# Patient Record
Sex: Male | Born: 1960 | Race: White | Hispanic: No | Marital: Married | Smoking: Never smoker
Health system: Southern US, Community
[De-identification: ages and names within clinical notes are randomized; demographics above are authoritative.]

## PROBLEM LIST (undated history)

## (undated) DIAGNOSIS — F419 Anxiety disorder, unspecified: Secondary | ICD-10-CM

## (undated) DIAGNOSIS — L989 Disorder of the skin and subcutaneous tissue, unspecified: Secondary | ICD-10-CM

## (undated) DIAGNOSIS — F329 Major depressive disorder, single episode, unspecified: Secondary | ICD-10-CM

## (undated) DIAGNOSIS — T7840XA Allergy, unspecified, initial encounter: Secondary | ICD-10-CM

## (undated) DIAGNOSIS — Z8619 Personal history of other infectious and parasitic diseases: Secondary | ICD-10-CM

## (undated) DIAGNOSIS — I10 Essential (primary) hypertension: Secondary | ICD-10-CM

## (undated) DIAGNOSIS — F32A Depression, unspecified: Secondary | ICD-10-CM

## (undated) HISTORY — DX: Anxiety disorder, unspecified: F41.9

## (undated) HISTORY — DX: Major depressive disorder, single episode, unspecified: F32.9

## (undated) HISTORY — PX: TONSILLECTOMY AND ADENOIDECTOMY: SUR1326

## (undated) HISTORY — DX: Personal history of other infectious and parasitic diseases: Z86.19

## (undated) HISTORY — DX: Disorder of the skin and subcutaneous tissue, unspecified: L98.9

## (undated) HISTORY — DX: Depression, unspecified: F32.A

## (undated) HISTORY — DX: Allergy, unspecified, initial encounter: T78.40XA

## (undated) HISTORY — DX: Essential (primary) hypertension: I10

---

## 2014-07-06 ENCOUNTER — Ambulatory Visit (INDEPENDENT_AMBULATORY_CARE_PROVIDER_SITE_OTHER): Payer: 59 | Admitting: Internal Medicine

## 2014-07-06 ENCOUNTER — Encounter: Payer: Self-pay | Admitting: Internal Medicine

## 2014-07-06 VITALS — BP 118/78 | HR 83 | Temp 98.0°F | Resp 14 | Ht 73.5 in | Wt 215.2 lb

## 2014-07-06 DIAGNOSIS — E559 Vitamin D deficiency, unspecified: Secondary | ICD-10-CM

## 2014-07-06 DIAGNOSIS — H18603 Keratoconus, unspecified, bilateral: Secondary | ICD-10-CM | POA: Diagnosis not present

## 2014-07-06 DIAGNOSIS — I10 Essential (primary) hypertension: Secondary | ICD-10-CM

## 2014-07-06 DIAGNOSIS — Z113 Encounter for screening for infections with a predominantly sexual mode of transmission: Secondary | ICD-10-CM

## 2014-07-06 DIAGNOSIS — Z13220 Encounter for screening for lipoid disorders: Secondary | ICD-10-CM | POA: Diagnosis not present

## 2014-07-06 DIAGNOSIS — Z1211 Encounter for screening for malignant neoplasm of colon: Secondary | ICD-10-CM

## 2014-07-06 DIAGNOSIS — Z125 Encounter for screening for malignant neoplasm of prostate: Secondary | ICD-10-CM

## 2014-07-06 DIAGNOSIS — F320 Major depressive disorder, single episode, mild: Secondary | ICD-10-CM

## 2014-07-06 DIAGNOSIS — R5383 Other fatigue: Secondary | ICD-10-CM

## 2014-07-06 LAB — CBC WITH DIFFERENTIAL/PLATELET
Basophils Absolute: 0 10*3/uL (ref 0.0–0.1)
Basophils Relative: 0 % (ref 0–1)
Eosinophils Absolute: 0.1 10*3/uL (ref 0.0–0.7)
Eosinophils Relative: 2 % (ref 0–5)
HCT: 42.4 % (ref 39.0–52.0)
Hemoglobin: 14.4 g/dL (ref 13.0–17.0)
Lymphocytes Relative: 42 % (ref 12–46)
Lymphs Abs: 2.2 10*3/uL (ref 0.7–4.0)
MCH: 29.4 pg (ref 26.0–34.0)
MCHC: 34 g/dL (ref 30.0–36.0)
MCV: 86.7 fL (ref 78.0–100.0)
MPV: 9.7 fL (ref 8.6–12.4)
Monocytes Absolute: 0.6 10*3/uL (ref 0.1–1.0)
Monocytes Relative: 11 % (ref 3–12)
Neutro Abs: 2.3 10*3/uL (ref 1.7–7.7)
Neutrophils Relative %: 45 % (ref 43–77)
Platelets: 296 10*3/uL (ref 150–400)
RBC: 4.89 MIL/uL (ref 4.22–5.81)
RDW: 13 % (ref 11.5–15.5)
WBC: 5.2 10*3/uL (ref 4.0–10.5)

## 2014-07-06 NOTE — Progress Notes (Signed)
Patient ID: Kevin BickerDavid Barry, male   DOB: 06/06/1960, 54 y.o.   MRN: [id number]  Patient Active Problem List   Diagnosis Date Noted  . Essential hypertension 07/08/2014  . Screening for hyperlipidemia 07/08/2014  . Major depressive disorder, single episode 07/08/2014  . Keratoconus of both eyes 07/08/2014    Subjective:  CC:   Chief Complaint  Patient presents with  . Establish Care    HPI:   Kevin Barry a 54 y.o. male who presents for establishment of primary care with several issues;  1) vision changes.  He has a history of keratoconus (corneal curvature) of both eyes that [place] .  Did not tolerate bifocals .  The condition ins bilateral, but R > L .  He has had the condition for years .   2) weight gain of 10 lbs.  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  no history  Of syncope,   GPs died in 3270's of CAD, stroke  .  3) Health screening : colonoscopy needed  PSA needed  4) Hypertension:  He has been taking lisinopril daily x 10 years for BP   5) Depression:  He has been taking wellbutrin daily for 10 years   SR 150 no dose change  Did not tolerate the once daily XL .  Has occasional  trouble sleeping. dtr has Down's 54 yrs old,  [facility] . uses benadryl 25 mg prn   Has 4 kids two from each marriage.    Past Medical History  Diagnosis Date  . Depression   . Allergy   . Hypertension   . History of chicken pox   . Disorder of keratinization     right eye.        @ALL @  Past Surgical History  Procedure Laterality Date  . Tonsillectomy and adenoidectomy      History   Social History  . Marital Status: Married    Spouse Name: N/A  . Number of Children: N/A  . Years of Education: N/A   Occupational History  . Not on file.   Social History Main Topics  . Smoking status: Never Smoker   . Smokeless tobacco: Never Used  .  Alcohol Use: Yes     Comment: maybe once a week  . Drug Use: No  . Sexual Activity: Not on file   Other Topics Concern  . Not on file   Social History Narrative   Family History  Problem Relation Age of Onset  . Cancer Mother     Hodgkin disease  . Heart disease Mother 4972    aortic valve replacement  . Heart disease Father 4556    MI  . Early death Father 7156    acute MI  . Crohn's disease Brother   . Heart disease Maternal Grandmother   . Stroke Maternal Grandmother   . Cancer Maternal Grandmother     unsure which cancer  . Cancer Maternal Grandfather     pancreatic cancer?  . Heart disease Paternal Grandfather   . Stroke Paternal Grandfather       Review of Systems: Patient denies headache, fevers, malaise, unintentional weight loss, skin rash, eye pain, sinus congestion and sinus pain, sore throat, dysphagia,  hemoptysis , cough, dyspnea, wheezing, chest pain, palpitations, orthopnea, edema, abdominal pain, nausea, melena, diarrhea, constipation, flank pain, dysuria, hematuria, urinary  Frequency,  nocturia, numbness, tingling, seizures,  Focal weakness, Loss of consciousness,  Tremor, insomnia, depression, anxiety, and suicidal ideation.     Objective:  BP 118/78 mmHg  Pulse 83  Temp(Src) 98 F (36.7 C) (Oral)  Resp 14  Ht 6' 1.5" (1.867 m)  Wt 215 lb 4 oz (97.637 kg)  BMI 28.01 kg/m2  SpO2 97%  General appearance: alert, cooperative and appears stated age Ears: normal TM's and external ear canals both ears Throat: lips, mucosa, and tongue normal; teeth and gums normal Neck: no adenopathy, no carotid bruit, supple, symmetrical, trachea midline and thyroid not enlarged, symmetric, no tenderness/mass/nodules Back: symmetric, no curvature. ROM normal. No CVA tenderness. Lungs: clear to auscultation bilaterally Heart: regular rate and rhythm, S1, S2 normal, no murmur, click, rub or gallop Abdomen: soft, non-tender; bowel sounds normal; no masses,  no  organomegaly Pulses: 2+ and symmetric Skin: Skin color, texture, turgor normal. No rashes or lesions Lymph nodes: Cervical, supraclavicular, and axillary nodes normal.  Assessment and Plan:  Essential hypertension Managed for ten years with lisinopril. Cr and lytes are normal today   Lab Results  Component Value Date   CREATININE 1.00 07/06/2014   Lab Results  Component Value Date   NA 141 07/06/2014   K 4.5 07/06/2014   CL 101 07/06/2014   CO2 30 07/06/2014      Screening for hyperlipidemia LDL and triglycerides are normal on diet alone .   Lab Results  Component Value Date   CHOL 172 07/06/2014   HDL 37* 07/06/2014   LDLCALC 112* 07/06/2014   TRIG 117 07/06/2014   CHOLHDL 4.6 07/06/2014      Major depressive disorder, single episode Managed with wellbutrin XL. No changes today    Keratoconus of both eyes Referral to [facility] in process.    Updated Medication List Outpatient Encounter Prescriptions as of 07/06/2014  Medication Sig  . buPROPion (WELLBUTRIN SR) 150 MG 12 hr tablet Take 150 mg by mouth 2 (two) times daily.  Marland Kitchen lisinopril (PRINIVIL,ZESTRIL) 5 MG tablet Take 5 mg by mouth daily.     Orders Placed This Encounter  Procedures  . HIV antibody  . Hepatitis C antibody  . CBC with Differential/Platelet  . Comprehensive metabolic panel  . Lipid panel  . TSH  . PSA  . Vitamin D (25 hydroxy)  . Ambulatory referral to Ophthalmology  . Ambulatory referral to General Surgery    Return in about 6 months (around 01/05/2015).

## 2014-07-06 NOTE — Patient Instructions (Signed)
It was very nice to meet you!  We will call you with the results of your labs today  Referral to  eye and  Surgical are in process

## 2014-07-06 NOTE — Progress Notes (Signed)
Pre visit review using our clinic review tool, if applicable. No additional management support is needed unless otherwise documented below in the visit note. 

## 2014-07-07 LAB — VITAMIN D 25 HYDROXY (VIT D DEFICIENCY, FRACTURES): Vit D, 25-Hydroxy: 27 ng/mL — ABNORMAL LOW (ref 30–100)

## 2014-07-07 LAB — COMPREHENSIVE METABOLIC PANEL
ALT: 31 U/L (ref 0–53)
AST: 22 U/L (ref 0–37)
Albumin: 4.6 g/dL (ref 3.5–5.2)
Alkaline Phosphatase: 45 U/L (ref 39–117)
BUN: 13 mg/dL (ref 6–23)
CO2: 30 mEq/L (ref 19–32)
Calcium: 9.6 mg/dL (ref 8.4–10.5)
Chloride: 101 mEq/L (ref 96–112)
Creat: 1 mg/dL (ref 0.50–1.35)
Glucose, Bld: 71 mg/dL (ref 70–99)
Potassium: 4.5 mEq/L (ref 3.5–5.3)
Sodium: 141 mEq/L (ref 135–145)
Total Bilirubin: 0.4 mg/dL (ref 0.2–1.2)
Total Protein: 7.9 g/dL (ref 6.0–8.3)

## 2014-07-07 LAB — LIPID PANEL
Cholesterol: 172 mg/dL (ref 0–200)
HDL: 37 mg/dL — ABNORMAL LOW (ref >=40)
LDL Cholesterol: 112 mg/dL — ABNORMAL HIGH (ref 0–99)
Total CHOL/HDL Ratio: 4.6 Ratio
Triglycerides: 117 mg/dL (ref <150)
VLDL: 23 mg/dL (ref 0–40)

## 2014-07-07 LAB — PSA: PSA: 1.22 ng/mL (ref <=4.00)

## 2014-07-07 LAB — HEPATITIS C ANTIBODY: HCV Ab: NEGATIVE

## 2014-07-07 LAB — HIV ANTIBODY (ROUTINE TESTING W REFLEX): HIV 1&2 Ab, 4th Generation: NONREACTIVE

## 2014-07-07 LAB — TSH: TSH: 1.42 u[IU]/mL (ref 0.350–4.500)

## 2014-07-08 ENCOUNTER — Encounter: Payer: Self-pay | Admitting: Internal Medicine

## 2014-07-08 DIAGNOSIS — I10 Essential (primary) hypertension: Secondary | ICD-10-CM | POA: Insufficient documentation

## 2014-07-08 DIAGNOSIS — H18603 Keratoconus, unspecified, bilateral: Secondary | ICD-10-CM | POA: Insufficient documentation

## 2014-07-08 DIAGNOSIS — Z0000 Encounter for general adult medical examination without abnormal findings: Secondary | ICD-10-CM | POA: Insufficient documentation

## 2014-07-08 DIAGNOSIS — F329 Major depressive disorder, single episode, unspecified: Secondary | ICD-10-CM | POA: Insufficient documentation

## 2014-07-08 NOTE — Assessment & Plan Note (Signed)
LDL and triglycerides are normal on diet alone .   Lab Results  Component Value Date   CHOL 172 07/06/2014   HDL 37* 07/06/2014   LDLCALC 112* 07/06/2014   TRIG 117 07/06/2014   CHOLHDL 4.6 07/06/2014

## 2014-07-08 NOTE — Assessment & Plan Note (Signed)
Managed for ten years with lisinopril. Cr and lytes are normal today   Lab Results  Component Value Date   CREATININE 1.00 07/06/2014   Lab Results  Component Value Date   NA 141 07/06/2014   K 4.5 07/06/2014   CL 101 07/06/2014   CO2 30 07/06/2014

## 2014-07-08 NOTE — Assessment & Plan Note (Signed)
Managed with wellbutrin XL. No changes today

## 2014-07-08 NOTE — Assessment & Plan Note (Signed)
Referral to [facility] in process.

## 2014-07-09 ENCOUNTER — Encounter: Payer: Self-pay | Admitting: *Deleted

## 2014-07-12 ENCOUNTER — Encounter: Payer: Self-pay | Admitting: *Deleted

## 2014-07-25 ENCOUNTER — Encounter: Payer: Self-pay | Admitting: General Surgery

## 2014-07-25 ENCOUNTER — Telehealth: Payer: Self-pay

## 2014-07-25 ENCOUNTER — Ambulatory Visit (INDEPENDENT_AMBULATORY_CARE_PROVIDER_SITE_OTHER): Payer: 59 | Admitting: General Surgery

## 2014-07-25 VITALS — BP 148/86 | HR 82 | Resp 14 | Ht 74.0 in | Wt 214.0 lb

## 2014-07-25 DIAGNOSIS — Z1211 Encounter for screening for malignant neoplasm of colon: Secondary | ICD-10-CM

## 2014-07-25 MED ORDER — POLYETHYLENE GLYCOL 3350 17 GM/SCOOP PO POWD: ORAL | Status: DC

## 2014-07-25 NOTE — Telephone Encounter (Signed)
Kevin Barry from [facility] called and is hoping to verify that a procedure this pt is having is authorized.  Callback - [phone]

## 2014-07-25 NOTE — Patient Instructions (Signed)
Patient has been scheduled for a colonoscopy on 10-03-14 at [facility].

## 2014-07-25 NOTE — Progress Notes (Signed)
Patient ID: Kevin Barry Hefley, male   DOB: 07/09/1960, 54 y.o.   MRN: [id number]  Chief Complaint  Patient presents with  . Other    colonoscopy sreening    HPI Kevin Barry Retz is a 54 y.o. male here today for a evaluation of a screening colonoscopy. Patient states no GI problems at this time.   The patient is a native of [place]. [place]'s [place] the states [place] about [place] years. HPI  Past Medical History  Diagnosis Date  . Depression   . Allergy   . Hypertension   . History of chicken pox   . Disorder of keratinization     right eye.     Past Surgical History  Procedure Laterality Date  . Tonsillectomy and adenoidectomy      Family History  Problem Relation Age of Onset  . Cancer Mother     Hodgkin disease  . Heart disease Mother 3772    aortic valve replacement  . Heart disease Father 5656    MI  . Early death Father 2756    acute MI  . Crohn's disease Brother   . Heart disease Maternal Grandmother   . Stroke Maternal Grandmother   . Cancer Maternal Grandmother     unsure which cancer  . Cancer Maternal Grandfather     pancreatic cancer?  . Heart disease Paternal Grandfather   . Stroke Paternal Grandfather     Social History History  Substance Use Topics  . Smoking status: Never Smoker   . Smokeless tobacco: Never Used  . Alcohol Use: Yes     Comment: maybe once a week    Allergies  Allergen Reactions  . Penicillins Rash    Current Outpatient Prescriptions  Medication Sig Dispense Refill  . buPROPion (WELLBUTRIN SR) 150 MG 12 hr tablet Take 150 mg by mouth 2 (two) times daily.    Marland Kitchen. lisinopril (PRINIVIL,ZESTRIL) 5 MG tablet Take 5 mg by mouth daily.    . polyethylene glycol powder (GLYCOLAX/MIRALAX) powder 255 grams one bottle for colonoscopy prep 255 g 0   No current facility-administered medications for this visit.    Review of Systems Review of Systems  Constitutional: Negative.   Respiratory: Negative.   Cardiovascular: Negative.    Gastrointestinal: Negative.     Blood pressure 148/86, pulse 82, resp. rate 14, height 6\' 2"  (1.88 m), weight 214 lb (97.07 kg).  Physical Exam Physical Exam  Constitutional: He is oriented to person, place, and time. He appears well-developed and well-nourished.  Cardiovascular: Normal rate, regular rhythm and normal heart sounds.   Pulmonary/Chest: Effort normal and breath sounds normal.  Neurological: He is alert and oriented to person, place, and time.  Skin: Skin is warm.    Data Reviewed Laboratory studies of 07/06/2014 reviewed. Normal renal function and hepatic function. Normal CBC and differential.  Assessment    Candidate for screening colonoscopy.    Plan    Colonoscopy with possible biopsy/polypectomy prn: Information regarding the procedure, including its potential risks and complications (including but not limited to perforation of the bowel, which may require emergency surgery to repair, and bleeding) was verbally given to the patient. Educational information regarding lower instestinal endoscopy was given to the patient. Written instructions for how to complete the bowel prep using Miralax were provided. The importance of drinking ample fluids to avoid dehydration as a result of the prep emphasized.    Patient has been scheduled for a colonoscopy on 10-03-14 at [facility].  PCP:  Darrick Huntsmanullo,  [place] 07/25/2014, 8:00 PM

## 2014-07-26 ENCOUNTER — Other Ambulatory Visit: Payer: Self-pay | Admitting: General Surgery

## 2014-07-26 DIAGNOSIS — Z1211 Encounter for screening for malignant neoplasm of colon: Secondary | ICD-10-CM

## 2014-09-14 ENCOUNTER — Other Ambulatory Visit: Payer: Self-pay | Admitting: General Surgery

## 2014-09-14 DIAGNOSIS — Z1211 Encounter for screening for malignant neoplasm of colon: Secondary | ICD-10-CM

## 2014-09-14 NOTE — H&P (Signed)
Patient ID: Kevin Barry, male DOB: 1961-03-25, 54 y.o. MRN: [id number]  Chief Complaint  Patient presents with  . Other    colonoscopy sreening    HPI Kevin Barry is a 54 y.o. male here today for a evaluation of a screening colonoscopy. Patient states no GI problems at this time.   The patient is a native of [place], [place]. He's been [place] the states for about [place] years. HPI  Past Medical History  Diagnosis Date  . Depression   . Allergy   . Hypertension   . History of chicken pox   . Disorder of keratinization     right eye.     Past Surgical History  Procedure Laterality Date  . Tonsillectomy and adenoidectomy      Family History  Problem Relation Age of Onset  . Cancer Mother     Hodgkin disease  . Heart disease Mother 68    aortic valve replacement  . Heart disease Father 53    MI  . Early death Father 36    acute MI  . Crohn's disease Brother   . Heart disease Maternal Grandmother   . Stroke Maternal Grandmother   . Cancer Maternal Grandmother     unsure which cancer  . Cancer Maternal Grandfather     pancreatic cancer?  . Heart disease Paternal Grandfather   . Stroke Paternal Grandfather     Social History History  Substance Use Topics  . Smoking status: Never Smoker   . Smokeless tobacco: Never Used  . Alcohol Use: Yes     Comment: maybe once a week    Allergies  Allergen Reactions  . Penicillins Rash    Current Outpatient Prescriptions  Medication Sig Dispense Refill  . buPROPion (WELLBUTRIN SR) 150 MG 12 hr tablet Take 150 mg by mouth 2 (two) times daily.    Marland Kitchen lisinopril (PRINIVIL,ZESTRIL) 5 MG tablet Take 5 mg by mouth daily.    . polyethylene glycol powder (GLYCOLAX/MIRALAX) powder 255 grams one bottle for colonoscopy prep 255 g 0   No current facility-administered medications  for this visit.    Review of Systems Review of Systems  Constitutional: Negative.  Respiratory: Negative.  Cardiovascular: Negative.  Gastrointestinal: Negative.    Blood pressure 148/86, pulse 82, resp. rate 14, height 6\' 2"  (1.88 m), weight 214 lb (97.07 kg).  Physical Exam Physical Exam  Constitutional: He is oriented to person, place, and time. He appears well-developed and well-nourished.  Cardiovascular: Normal rate, regular rhythm and normal heart sounds.  Pulmonary/Chest: Effort normal and breath sounds normal.  Neurological: He is alert and oriented to person, place, and time.  Skin: Skin is warm.    Data Reviewed Laboratory studies of 07/06/2014 reviewed. Normal renal function and hepatic function. Normal CBC and differential.  Assessment    Candidate for screening colonoscopy.    Plan    Colonoscopy with possible biopsy/polypectomy prn: Information regarding the procedure, including its potential risks and complications (including but not limited to perforation of the bowel, which may require emergency surgery to repair, and bleeding) was verbally given to the patient. Educational information regarding lower instestinal endoscopy was given to the patient. Written instructions for how to complete the bowel prep using Miralax were provided. The importance of drinking ample fluids to avoid dehydration as a result of the prep emphasized.    Patient has been scheduled for a colonoscopy on 10-03-14 at [facility].  PCP: Macky Lower

## 2014-09-26 ENCOUNTER — Telehealth: Payer: Self-pay | Admitting: *Deleted

## 2014-09-26 NOTE — Telephone Encounter (Signed)
Message left on home and cell numbers for patient to call the office.   We need to confirm no medication changes since last office visit. Also, need to confirm patient has Miralax prescription.   Patient is currently scheduled for a colonoscopy on Wednesday, 10-03-14.

## 2014-09-27 NOTE — Telephone Encounter (Signed)
Patient called the office back to report he has an unexpected trip to [place] and will need to reschedule colonoscopy.   Colonoscopy has been moved from 10-03-14 to 12-05-14 at [facility]. Trish aware of date change.

## 2014-11-28 ENCOUNTER — Telehealth: Payer: Self-pay | Admitting: *Deleted

## 2014-11-28 NOTE — Telephone Encounter (Signed)
Patient called back to report no medication changes since last office visit. Also, states he has already picked up Miralax prescription.  This patient was instructed to call the office should he have further questions.   We will proceed with colonoscopy as scheduled.

## 2014-11-28 NOTE — Telephone Encounter (Signed)
Message left on home and cell numbers for patient to call the office.   Patient is scheduled for a colonoscopy on 12-05-14 at [facility]. We need to confirm no medication changes since last office visit. Also, need to make sure he has Miralax prescription.

## 2014-12-05 ENCOUNTER — Ambulatory Visit
Admission: RE | Admit: 2014-12-05 | Discharge: 2014-12-05 | Disposition: A | Discharge status: Home / Self Care | Payer: 59 | Source: Ambulatory Visit | Attending: General Surgery | Admitting: General Surgery

## 2014-12-05 ENCOUNTER — Encounter
Admission: RE | Disposition: A | Discharge status: Home / Self Care | Payer: Self-pay | Source: Ambulatory Visit | Attending: General Surgery

## 2014-12-05 ENCOUNTER — Ambulatory Visit: Payer: 59 | Admitting: Anesthesiology

## 2014-12-05 ENCOUNTER — Encounter: Payer: Self-pay | Admitting: *Deleted

## 2014-12-05 DIAGNOSIS — F329 Major depressive disorder, single episode, unspecified: Secondary | ICD-10-CM | POA: Diagnosis not present

## 2014-12-05 DIAGNOSIS — K5730 Diverticulosis of large intestine without perforation or abscess without bleeding: Secondary | ICD-10-CM | POA: Diagnosis not present

## 2014-12-05 DIAGNOSIS — I10 Essential (primary) hypertension: Secondary | ICD-10-CM | POA: Insufficient documentation

## 2014-12-05 DIAGNOSIS — Z1211 Encounter for screening for malignant neoplasm of colon: Secondary | ICD-10-CM | POA: Insufficient documentation

## 2014-12-05 DIAGNOSIS — Z880 Allergy status to penicillin: Secondary | ICD-10-CM | POA: Insufficient documentation

## 2014-12-05 DIAGNOSIS — K621 Rectal polyp: Secondary | ICD-10-CM | POA: Diagnosis not present

## 2014-12-05 HISTORY — PX: COLONOSCOPY WITH PROPOFOL: SHX5780

## 2014-12-05 SURGERY — COLONOSCOPY WITH PROPOFOL
Anesthesia: General

## 2014-12-05 MED ORDER — PROPOFOL INFUSION 10 MG/ML OPTIME
INTRAVENOUS | Status: DC | PRN
  Administered 2014-12-05: 180 ug/kg/min via INTRAVENOUS

## 2014-12-05 MED ORDER — LIDOCAINE HCL (CARDIAC) 20 MG/ML IV SOLN
INTRAVENOUS | Status: DC | PRN
  Administered 2014-12-05: 60 mg via INTRAVENOUS

## 2014-12-05 MED ORDER — SODIUM CHLORIDE 0.9 % IV SOLN
INTRAVENOUS | Status: DC
  Administered 2014-12-05: 1000 mL via INTRAVENOUS

## 2014-12-05 MED ORDER — PROPOFOL 10 MG/ML IV BOLUS
INTRAVENOUS | Status: DC | PRN
  Administered 2014-12-05: 20 mg via INTRAVENOUS
  Administered 2014-12-05: 50 mg via INTRAVENOUS

## 2014-12-05 NOTE — Op Note (Signed)
[facility] Patient Name: Kevin Barry Procedure Date: 12/05/2014 9:04 AM MRN: [id number] Account #: [id number] Date of Birth: 09/01/1960 Admit Type: Outpatient Age: 54 Room: [facility] ENDO ROOM 4 Gender: Male Note Status: Finalized Procedure:         Colonoscopy Indications:       Screening for colorectal malignant neoplasm Providers:         Earline Mayotte, MD Referring MD:      Duncan Dull, MD (Referring MD) Medicines:         Sedation Required Anesthesia Staff Assistance, Monitored                     Anesthesia Care Complications:     No immediate complications. Procedure:         Pre-Anesthesia Assessment:                    - Prior to the procedure, a History and Physical was                     performed, and patient medications, allergies and                     sensitivities were reviewed. The patient's tolerance of                     previous anesthesia was reviewed.                    - The risks and benefits of the procedure and the sedation                     options and risks were discussed with the patient. All                     questions were answered and informed consent was obtained.                    After obtaining informed consent, the colonoscope was                     passed under direct vision. Throughout the procedure, the                     patient's blood pressure, pulse, and oxygen saturations                     were monitored continuously. The Olympus CF-Q160AL                     colonoscope (S#. [id number]) was introduced through the anus                     and advanced to the the cecum, identified by appendiceal                     orifice and ileocecal valve. The colonoscopy was performed                     without difficulty. The patient tolerated the procedure                     well. The quality of the bowel preparation was adequate to  identify polyps. Findings:      A few  large-mouthed diverticula were found in the sigmoid colon.      A 5 mm polyp was found in the rectum. The polyp was sessile. This was       biopsied with a cold forceps for histology.      The retroflexed view of the distal rectum and anal verge was normal and       showed no anal or rectal abnormalities. Impression:        - Diverticulosis in the sigmoid colon.                    - One 5 mm polyp in the rectum. Biopsied.                    - The distal rectum and anal verge are normal on                     retroflexion view. Recommendation:    - Telephone endoscopist for pathology results in 1 week.                    - High fiber diet indefinitely. Procedure Code(s): --- Professional ---                    [phone], Colonoscopy, flexible; with biopsy, single or                     multiple Diagnosis Code(s): --- Professional ---                    Z12.11, Encounter for screening for malignant neoplasm of                     colon                    K62.1, Rectal polyp                    K57.30, Diverticulosis of large intestine without                     perforation or abscess without bleeding CPT copyright 2014 American Medical Association. All rights reserved. The codes documented in this report are preliminary and upon coder review may  be revised to meet current compliance requirements. Earline Mayotte, MD 12/05/2014 9:33:13 AM This report has been signed electronically. Number of Addenda: 0 Note Initiated On: 12/05/2014 9:04 AM Scope Withdrawal Time: 0 hours 11 minutes 19 seconds  Total Procedure Duration: 0 hours 19 minutes 53 seconds       [facility]

## 2014-12-05 NOTE — H&P (Signed)
Kevin Barry [id number] [birth year]     HPI: Candidate for screening colonoscopy. No health changes since initial evaluation in April 2016.      Allergies  Allergen Reactions  . Penicillins Rash   Past Medical History  Diagnosis Date  . Depression   . Allergy   . Hypertension   . History of chicken pox   . Disorder of keratinization     right eye.    Past Surgical History  Procedure Laterality Date  . Tonsillectomy and adenoidectomy     Social History   Social History  . Marital Status: Married    Spouse Name: N/A  . Number of Children: N/A  . Years of Education: N/A   Occupational History  . Not on file.   Social History Main Topics  . Smoking status: Never Smoker   . Smokeless tobacco: Never Used  . Alcohol Use: Yes     Comment: maybe once a week  . Drug Use: No  . Sexual Activity: Not on file   Other Topics Concern  . Not on file   Social History Narrative   Social History   Social History Narrative     ROS: Negative.     PE: HEENT: Negative. Lungs: Clear. Cardio: RR. Earline Mayotte 12/05/2014   Assessment/Plan:  Proceed with planned endoscopy.

## 2014-12-05 NOTE — Anesthesia Procedure Notes (Signed)
Performed by: Maveryk Renstrom Pre-anesthesia Checklist: Patient identified, Emergency Drugs available, Suction available, Patient being monitored and Timeout performed Patient Re-evaluated:Patient Re-evaluated prior to inductionOxygen Delivery Method: Nasal cannula Intubation Type: IV induction       

## 2014-12-05 NOTE — Transfer of Care (Signed)
Immediate Anesthesia Transfer of Care Note  Patient: Kevin Barry  Procedure(s) Performed: Procedure(s): COLONOSCOPY WITH PROPOFOL (N/A)  Patient Location: PACU  Anesthesia Type:General  Level of Consciousness: sedated  Airway & Oxygen Therapy: Patient Spontanous Breathing and Patient connected to nasal cannula oxygen  Post-op Assessment: Report given to RN and Post -op Vital signs reviewed and stable  Post vital signs: Reviewed and stable  Last Vitals:  Filed Vitals:   12/05/14 0937  BP: 101/72  Pulse: 74  Temp: 35.6 C  Resp: 14    Complications: No apparent anesthesia complications

## 2014-12-05 NOTE — Anesthesia Preprocedure Evaluation (Signed)
Anesthesia Evaluation  Patient identified by MRN, date of birth, ID band Patient awake    Reviewed: Allergy & Precautions, H&P , NPO status , Patient's Chart, lab work & pertinent test results, reviewed documented beta blocker date and time   History of Anesthesia Complications Negative for: history of anesthetic complications  Airway Mallampati: I  TM Distance: >3 FB Neck ROM: full    Dental no notable dental hx. (+) Teeth Intact   Pulmonary neg pulmonary ROS,    Pulmonary exam normal breath sounds clear to auscultation       Cardiovascular Exercise Tolerance: Good hypertension, (-) angina(-) CAD, (-) Past MI, (-) Cardiac Stents and (-) CABG Normal cardiovascular exam(-) dysrhythmias (-) Valvular Problems/Murmurs Rhythm:regular Rate:Normal     Neuro/Psych PSYCHIATRIC DISORDERS (depression) negative neurological ROS     GI/Hepatic negative GI ROS, Neg liver ROS,   Endo/Other  negative endocrine ROS  Renal/GU negative Renal ROS  negative genitourinary   Musculoskeletal   Abdominal   Peds  Hematology negative hematology ROS (+)   Anesthesia Other Findings Past Medical History:   Depression                                                   Allergy                                                      Hypertension                                                 History of chicken pox                                       Disorder of keratinization                                     Comment:right eye.    Reproductive/Obstetrics negative OB ROS                             Anesthesia Physical Anesthesia Plan  ASA: II  Anesthesia Plan: General   Post-op Pain Management:    Induction:   Airway Management Planned:   Additional Equipment:   Intra-op Plan:   Post-operative Plan:   Informed Consent: I have reviewed the patients History and Physical, chart, labs and discussed the  procedure including the risks, benefits and alternatives for the proposed anesthesia with the patient or authorized representative who has indicated his/her understanding and acceptance.   Dental Advisory Given  Plan Discussed with: Anesthesiologist, CRNA and Surgeon  Anesthesia Plan Comments:         Anesthesia Quick Evaluation

## 2014-12-06 LAB — SURGICAL PATHOLOGY

## 2014-12-07 ENCOUNTER — Encounter: Payer: Self-pay | Admitting: General Surgery

## 2014-12-07 ENCOUNTER — Telehealth: Payer: Self-pay

## 2014-12-07 NOTE — Telephone Encounter (Signed)
-----   Message from Earline Mayotte, MD sent at 12/07/2014  7:22 AM EDT ----- Please notify the patient that the small polyp removed from the rectum at the time of Wednesdays colonoscopy was entirely benign. He should plan on a follow-up examination in 10 years, earlier if he develops any change in bowel habits or rectal bleeding. Thank you  ----- Message -----    From: [facility]    Sent: 12/06/2014   5:38 PM      To: Earline Mayotte, MD

## 2014-12-07 NOTE — Telephone Encounter (Signed)
Message left for patient to call back for results.

## 2014-12-07 NOTE — Telephone Encounter (Signed)
Notified patient as instructed, patient pleased. Discussed follow-up in 10 years, patient agrees. Patient placed in recalls.   

## 2014-12-07 NOTE — Anesthesia Postprocedure Evaluation (Signed)
  Anesthesia Post-op Note  Patient: Kevin Barry  Procedure(s) Performed: Procedure(s): COLONOSCOPY WITH PROPOFOL (N/A)  Anesthesia type:General  Patient location: PACU  Post pain: Pain level controlled  Post assessment: Post-op Vital signs reviewed, Patient's Cardiovascular Status Stable, Respiratory Function Stable, Patent Airway and No signs of Nausea or vomiting  Post vital signs: Reviewed and stable  Last Vitals:  Filed Vitals:   12/05/14 1000  BP: 112/80  Pulse: 76  Temp:   Resp: 21    Level of consciousness: awake, alert  and patient cooperative  Complications: No apparent anesthesia complications

## 2015-04-22 ENCOUNTER — Encounter: Payer: Self-pay | Admitting: Internal Medicine

## 2015-04-22 ENCOUNTER — Ambulatory Visit (INDEPENDENT_AMBULATORY_CARE_PROVIDER_SITE_OTHER): Payer: BLUE CROSS/BLUE SHIELD | Admitting: Internal Medicine

## 2015-04-22 VITALS — BP 122/84 | HR 84 | Temp 97.9°F | Resp 12 | Ht 73.5 in | Wt 217.1 lb

## 2015-04-22 DIAGNOSIS — J209 Acute bronchitis, unspecified: Secondary | ICD-10-CM | POA: Diagnosis not present

## 2015-04-22 DIAGNOSIS — F321 Major depressive disorder, single episode, moderate: Secondary | ICD-10-CM

## 2015-04-22 DIAGNOSIS — R062 Wheezing: Secondary | ICD-10-CM

## 2015-04-22 MED ORDER — GUAIFENESIN-CODEINE 100-10 MG/5ML PO SYRP: 5.0000 mL | ORAL_SOLUTION | Freq: Three times a day (TID) | ORAL | Status: DC | PRN

## 2015-04-22 MED ORDER — ALBUTEROL SULFATE (2.5 MG/3ML) 0.083% IN NEBU
2.5000 mg | INHALATION_SOLUTION | Freq: Once | RESPIRATORY_TRACT | Status: AC
  Administered 2015-04-22: 2.5 mg via RESPIRATORY_TRACT

## 2015-04-22 MED ORDER — METHYLPREDNISOLONE ACETATE 40 MG/ML IJ SUSP
40.0000 mg | Freq: Once | INTRAMUSCULAR | Status: AC
  Administered 2015-04-22: 40 mg via INTRAMUSCULAR

## 2015-04-22 MED ORDER — BUSPIRONE HCL 10 MG PO TABS: 10.0000 mg | ORAL_TABLET | Freq: Three times a day (TID) | ORAL | Status: DC

## 2015-04-22 MED ORDER — ALBUTEROL SULFATE HFA 108 (90 BASE) MCG/ACT IN AERS: 2.0000 | INHALATION_SPRAY | Freq: Four times a day (QID) | RESPIRATORY_TRACT | Status: DC | PRN

## 2015-04-22 MED ORDER — PREDNISONE 10 MG PO TABS: ORAL_TABLET | ORAL | Status: DC

## 2015-04-22 NOTE — Patient Instructions (Signed)
You have a viral infection complicated by bronchitis.  I am prescribing a cough suppressant  And a prednisone taper to hep resolve the cough "  Guaifenesin with codeine: take every 8 hours as needed to suppress the cough  .Prednisone 6 day taper: 6 tablets all at once on Day 1,  Then taper by 1 tablet daily until gone Albuterol MDI : 2 puffs every 6 hour sif needed for chest tightness/wheezing    If you do not seen an improvement in a week,  Call for a chest x ray   I am adding buspirone up to 3 times daily to help you manage your anxiety/anger.  Start with 1/2 tablet .   Here are the names of several well respected male therapists in [place] that I recommend you call  Montel Clock    [phone]   Karen [place]   [phone] [place] [phone]  Anson Crofts  [phone]  Judithann Sheen    I will make the referral to Psychiatry.   Buspirone tablets What is this medicine? BUSPIRONE (byoo SPYE rone) is used to treat anxiety disorders. This medicine may be used for other purposes; ask your health care provider or pharmacist if you have questions. What should I tell my health care provider before I take this medicine? They need to know if you have any of these conditions: -kidney or liver disease -an unusual or allergic reaction to buspirone, other medicines, foods, dyes, or preservatives -pregnant or trying to get pregnant -breast-feeding How should I use this medicine? Take this medicine by mouth with a glass of water. Follow the directions on the prescription label. You may take this medicine with or without food. To ensure that this medicine always works the same way for you, you should take it either always with or always without food. Take your doses at regular intervals. Do not take your medicine more often than directed. Do not stop taking except on the advice of your doctor or health care professional. Talk to  your pediatrician regarding the use of this medicine in children. Special care may be needed. Overdosage: If you think you have taken too much of this medicine contact a poison control center or emergency room at once. NOTE: This medicine is only for you. Do not share this medicine with others. What if I miss a dose? If you miss a dose, take it as soon as you can. If it is almost time for your next dose, take only that dose. Do not take double or extra doses. What may interact with this medicine? Do not take this medicine with any of the following medications: -linezolid -MAOIs like Carbex, Eldepryl, Marplan, Nardil, and Parnate -methylene blue -procarbazine This medicine may also interact with the following medications: -diazepam -digoxin -diltiazem -erythromycin -grapefruit juice -haloperidol -medicines for mental depression or mood problems -medicines for seizures like carbamazepine, phenobarbital and phenytoin -nefazodone -other medications for anxiety -rifampin -ritonavir -some antifungal medicines like itraconazole, ketoconazole, and voriconazole -verapamil -warfarin This list may not describe all possible interactions. Give your health care provider a list of all the medicines, herbs, non-prescription drugs, or dietary supplements you use. Also tell them if you smoke, drink alcohol, or use illegal drugs. Some items may interact with your medicine. What should I watch for while using this medicine? Visit your doctor or health care professional for regular checks on your progress. It may take 1 to 2 weeks before your anxiety gets better. You may get  drowsy or dizzy. Do not drive, use machinery, or do anything that needs mental alertness until you know how this drug affects you. Do not stand or sit up quickly, especially if you are an older patient. This reduces the risk of dizzy or fainting spells. Alcohol can make you more drowsy and dizzy. Avoid alcoholic drinks. What side  effects may I notice from receiving this medicine? Side effects that you should report to your doctor or health care professional as soon as possible: -blurred vision or other vision changes -chest pain -confusion -difficulty breathing -feelings of hostility or anger -muscle aches and pains -numbness or tingling in hands or feet -ringing in the ears -skin rash and itching -vomiting -weakness Side effects that usually do not require medical attention (report to your doctor or health care professional if they continue or are bothersome): -disturbed dreams, nightmares -headache -nausea -restlessness or nervousness -sore throat and nasal congestion -stomach upset This list may not describe all possible side effects. Call your doctor for medical advice about side effects. You may report side effects to FDA at 1-800-FDA-1088. Where should I keep my medicine? Keep out of the reach of children. Store at room temperature below 30 degrees C (86 degrees F). Protect from light. Keep container tightly closed. Throw away any unused medicine after the expiration date. NOTE: This sheet is a summary. It may not cover all possible information. If you have questions about this medicine, talk to your doctor, pharmacist, or health care provider.    2016, Elsevier/[employer] Standard. (2009-10-24 18:06:11)

## 2015-04-22 NOTE — Progress Notes (Signed)
Pre-visit discussion using our clinic review tool. No additional management support is needed unless otherwise documented below in the visit note.  

## 2015-04-22 NOTE — Progress Notes (Signed)
Subjective:  Patient ID: Kevin Barry, male    DOB: [birth year]  Age: 55 y.o. MRN: [id number]  CC: The primary encounter diagnosis was Wheezing. Diagnoses of Acute bronchitis, unspecified organism and Major depressive disorder, single episode, moderate (HCC) were also pertinent to this visit.  HPI Kevin Barry presents for persistent cough and wheezing since Christmas.Marland Kitchen He was treated with a course of azithromycin on January 6th with no improvement in symptoms  on Jan 6th.  No prednisone was given . Prior to the onset of symptoms, he had flown commercially.  Symptoms initially felt like flu.  The entire household got sick as well.  No sinus symptoms currently, but started with congestion and productive cough.  Sputum has resolved but the dry cough and wheezing has persisted.   Had the flu vaccine   2) Depression  Chronic started over 15 years ago around the time of his first divorce.   Taking wellbutrin .  Low self esteem following his first divorce.  History of making self destructive decisions that punish him  Finds himself getting very angry , towards himself,  Gets argumentative has been shouting.  Current wife is very supportive.  No adultery,  More financially self destructive things.  No thought of harimng self.   Son has depression and OCD, now on Zoloft. Has been talking to his son often , they have a Close relationship and very honest relationship. Wakes up unmotivated,  Only his kids and wife keep him going. Waking up 2-3 times per night .    Outpatient Prescriptions Prior to Visit  Medication Sig Dispense Refill  . buPROPion (WELLBUTRIN SR) 150 MG 12 hr tablet Take 150 mg by mouth 2 (two) times daily.    Marland Kitchen lisinopril (PRINIVIL,ZESTRIL) 5 MG tablet Take 5 mg by mouth daily.     No facility-administered medications prior to visit.    Review of Systems;  Patient denies headache, fevers, malaise, unintentional weight loss, skin rash, eye pain, sinus congestion and sinus pain, sore  throat, dysphagia,  hemoptysis , cough, dyspnea, wheezing, chest pain, palpitations, orthopnea, edema, abdominal pain, nausea, melena, diarrhea, constipation, flank pain, dysuria, hematuria, urinary  Frequency, nocturia, numbness, tingling, seizures,  Focal weakness, Loss of consciousness,  Tremor, insomnia, depression, anxiety, and suicidal ideation.      Objective:  BP 122/84 mmHg  Pulse 84  Temp(Src) 97.9 F (36.6 C) (Oral)  Resp 12  Ht 6' 1.5" (1.867 m)  Wt 217 lb 2 oz (98.487 kg)  BMI 28.25 kg/m2  SpO2 99%  BP Readings from Last 3 Encounters:  04/22/15 122/84  12/05/14 112/80  07/25/14 148/86    Wt Readings from Last 3 Encounters:  04/22/15 217 lb 2 oz (98.487 kg)  12/05/14 210 lb (95.255 kg)  07/25/14 214 lb (97.07 kg)    General appearance: alert, cooperative and appears stated age Ears: normal TM's and external ear canals both ears Throat: lips, mucosa, and tongue normal; teeth and gums normal Neck: no adenopathy, no carotid bruit, supple, symmetrical, trachea midline and thyroid not enlarged, symmetric, no tenderness/mass/nodules Back: symmetric, no curvature. ROM normal. No CVA tenderness. Lungs: clear to auscultation bilaterally Heart: regular rate and rhythm, S1, S2 normal, no murmur, click, rub or gallop Abdomen: soft, non-tender; bowel sounds normal; no masses,  no organomegaly Pulses: 2+ and symmetric Skin: Skin color, texture, turgor normal. No rashes or lesions Lymph nodes: Cervical, supraclavicular, and axillary nodes normal.  No results found for: HGBA1C  Lab Results  Component Value Date  CREATININE 1.00 07/06/2014    Lab Results  Component Value Date   WBC 5.2 07/06/2014   HGB 14.4 07/06/2014   HCT 42.4 07/06/2014   PLT 296 07/06/2014   GLUCOSE 71 07/06/2014   CHOL 172 07/06/2014   TRIG 117 07/06/2014   HDL 37* 07/06/2014   LDLCALC 112* 07/06/2014   ALT 31 07/06/2014   AST 22 07/06/2014   NA 141 07/06/2014   K 4.5 07/06/2014   CL 101  07/06/2014   CREATININE 1.00 07/06/2014   BUN 13 07/06/2014   CO2 30 07/06/2014   TSH 1.420 07/06/2014   PSA 1.22 07/06/2014    No results found.  Assessment & Plan:   Problem List Items Addressed This Visit    Major depressive disorder, single episode    Continue wellbutrin, adding buspirone for management of outbursts  Of anger that appear to be anxiety related. As he wants to avoid controlled substances. Referral to Dr Maryruth Bun and patient given names of several therapists.       Relevant Medications   busPIRone (BUSPAR) 10 MG tablet   Other Relevant Orders   Ambulatory referral to Psychiatry   Acute bronchitis    Lung exam is normal,  No signs of sinusitis or otitis either.  Symptoms appear to be secondary to viral infection.  Prednisone taper,  Albuterol MDI< Tussionex and benzonotate for cough control.         Other Visit Diagnoses    Wheezing    -  Primary    Relevant Medications    methylPREDNISolone acetate (DEPO-MEDROL) injection 40 mg (Completed)    albuterol (PROVENTIL) (2.5 MG/3ML) 0.083% nebulizer solution 2.5 mg (Completed)       I am having Mr. Burch start on predniSONE, albuterol, guaiFENesin-codeine, and busPIRone. I am also having him maintain his lisinopril and buPROPion. We administered methylPREDNISolone acetate and albuterol.  Meds ordered this encounter  Medications  . predniSONE (DELTASONE) 10 MG tablet    Sig: 6 tablets on Day 1 , then reduce by 1 tablet daily until gone    Dispense:  21 tablet    Refill:  0  . albuterol (PROVENTIL HFA;VENTOLIN HFA) 108 (90 Base) MCG/ACT inhaler    Sig: Inhale 2 puffs into the lungs every 6 (six) hours as needed for wheezing or shortness of breath.    Dispense:  3.7 g    Refill:  0  . guaiFENesin-codeine (CHERATUSSIN AC) 100-10 MG/5ML syrup    Sig: Take 5 mLs by mouth 3 (three) times daily as needed for cough.    Dispense:  120 mL    Refill:  0  . busPIRone (BUSPAR) 10 MG tablet    Sig: Take 1 tablet (10 mg  total) by mouth 3 (three) times daily.    Dispense:  90 tablet    Refill:  2  . methylPREDNISolone acetate (DEPO-MEDROL) injection 40 mg    Sig:   . albuterol (PROVENTIL) (2.5 MG/3ML) 0.083% nebulizer solution 2.5 mg    Sig:    A total of 25 minutes of face to face time was spent with patient more than half of which was spent in counselling about the above mentioned conditions  and coordination of care  There are no discontinued medications.  Follow-up: No Follow-up on file.   Sherlene Shams, MD

## 2015-04-23 ENCOUNTER — Telehealth: Payer: Self-pay

## 2015-04-23 DIAGNOSIS — J209 Acute bronchitis, unspecified: Secondary | ICD-10-CM | POA: Insufficient documentation

## 2015-04-23 MED ORDER — HYDROCOD POLST-CPM POLST ER 10-8 MG/5ML PO SUER: 5.0000 mL | Freq: Every evening | ORAL | Status: DC | PRN

## 2015-04-23 NOTE — Telephone Encounter (Signed)
Placed at front desk

## 2015-04-23 NOTE — Telephone Encounter (Signed)
Pt states that the cough medicine Cheratussin AC you prescribed is not working still coughing all night long, pt would like to know if you can prescribe something stronger. Please advise

## 2015-04-23 NOTE — Telephone Encounter (Signed)
Called pt to let him know Dr. Darrick Huntsman printed out a Rx for cough and he can p/u this afternoon

## 2015-04-23 NOTE — Telephone Encounter (Signed)
Yes, but it is a controlled substance and can not be e mailed.  It will require pickup this afternoon. rx printed

## 2015-04-23 NOTE — Assessment & Plan Note (Signed)
Continue wellbutrin, adding buspirone for management of outbursts  Of anger that appear to be anxiety related. As he wants to avoid controlled substances. Referral to Dr Maryruth Bun and patient given names of several therapists.

## 2015-04-23 NOTE — Assessment & Plan Note (Signed)
Lung exam is normal,  No signs of sinusitis or otitis either.  Symptoms appear to be secondary to viral infection.  Prednisone taper,  Albuterol MDI< Tussionex and benzonotate for cough control.

## 2015-04-24 ENCOUNTER — Encounter: Payer: Self-pay | Admitting: Internal Medicine

## 2015-05-23 ENCOUNTER — Ambulatory Visit (INDEPENDENT_AMBULATORY_CARE_PROVIDER_SITE_OTHER): Payer: BLUE CROSS/BLUE SHIELD | Admitting: Internal Medicine

## 2015-05-23 ENCOUNTER — Encounter: Payer: Self-pay | Admitting: Internal Medicine

## 2015-05-23 VITALS — BP 112/70 | HR 70 | Temp 98.1°F | Resp 12 | Ht 73.0 in | Wt 214.5 lb

## 2015-05-23 DIAGNOSIS — F418 Other specified anxiety disorders: Secondary | ICD-10-CM

## 2015-05-23 DIAGNOSIS — F5105 Insomnia due to other mental disorder: Secondary | ICD-10-CM

## 2015-05-23 DIAGNOSIS — F341 Dysthymic disorder: Secondary | ICD-10-CM | POA: Diagnosis not present

## 2015-05-23 MED ORDER — PAROXETINE HCL ER 12.5 MG PO TB24: 12.5000 mg | ORAL_TABLET | Freq: Every day | ORAL | Status: DC

## 2015-05-23 MED ORDER — TRAZODONE HCL 50 MG PO TABS: 25.0000 mg | ORAL_TABLET | Freq: Every evening | ORAL | Status: DC | PRN

## 2015-05-23 NOTE — Progress Notes (Signed)
Pre-visit discussion using our clinic review tool. No additional management support is needed unless otherwise documented below in the visit note.  

## 2015-05-23 NOTE — Progress Notes (Signed)
Subjective:  Patient ID: Kevin Barry, male    DOB: 1960/06/07  Age: 55 y.o. MRN: [id number]  CC: The encounter diagnosis was Insomnia secondary to depression with anxiety.  HPI Kevin Barry presents for follow up on major depression with insomnia .  He has been on wellbutrin for years,  And was prescribed  buspirone at last visit in late January , The Buspar has been stopped due to intolerance.  It was causing agitation and light headedness,  He has no history of SSRI trials or use of trazodone  His main concern is weight gain and ED .  Saw Macy Mis ,  Has appt with Dr Maryruth Bun.  In April.   Trazodone discussed  And Paxil low dose   Outpatient Prescriptions Prior to Visit  Medication Sig Dispense Refill  . buPROPion (WELLBUTRIN SR) 150 MG 12 hr tablet Take 150 mg by mouth 2 (two) times daily.    Marland Kitchen lisinopril (PRINIVIL,ZESTRIL) 5 MG tablet Take 5 mg by mouth daily.    Marland Kitchen albuterol (PROVENTIL HFA;VENTOLIN HFA) 108 (90 Base) MCG/ACT inhaler Inhale 2 puffs into the lungs every 6 (six) hours as needed for wheezing or shortness of breath. (Patient not taking: Reported on 05/23/2015) 3.7 g 0  . busPIRone (BUSPAR) 10 MG tablet Take 1 tablet (10 mg total) by mouth 3 (three) times daily. 90 tablet 2  . chlorpheniramine-HYDROcodone (TUSSIONEX PENNKINETIC ER) 10-8 MG/5ML SUER Take 5 mLs by mouth at bedtime as needed for cough. 140 mL 0  . guaiFENesin-codeine (CHERATUSSIN AC) 100-10 MG/5ML syrup Take 5 mLs by mouth 3 (three) times daily as needed for cough. 120 mL 0  . predniSONE (DELTASONE) 10 MG tablet 6 tablets on Day 1 , then reduce by 1 tablet daily until gone 21 tablet 0   No facility-administered medications prior to visit.    Review of Systems;  Patient denies headache, fevers, malaise, unintentional weight loss, skin rash, eye pain, sinus congestion and sinus pain, sore throat, dysphagia,  hemoptysis , cough, dyspnea, wheezing, chest pain, palpitations, orthopnea, edema, abdominal pain, nausea,  melena, diarrhea, constipation, flank pain, dysuria, hematuria, urinary  Frequency, nocturia, numbness, tingling, seizures,  Focal weakness, Loss of consciousness,  Tremor, insomnia, depression, anxiety, and suicidal ideation.      Objective:  BP 112/70 mmHg  Pulse 70  Temp(Src) 98.1 F (36.7 C) (Oral)  Resp 12  Ht  (1.854 m)  Wt 214 lb 8 oz (97.297 kg)  BMI 28.31 kg/m2  SpO2 98%  BP Readings from Last 3 Encounters:  05/23/15 112/70  04/22/15 122/84  12/05/14 112/80    Wt Readings from Last 3 Encounters:  05/23/15 214 lb 8 oz (97.297 kg)  04/22/15 217 lb 2 oz (98.487 kg)  12/05/14 210 lb (95.255 kg)    General appearance: alert, cooperative and appears stated age Ears: normal TM's and external ear canals both ears Throat: lips, mucosa, and tongue normal; teeth and gums normal Neck: no adenopathy, no carotid bruit, supple, symmetrical, trachea midline and thyroid not enlarged, symmetric, no tenderness/mass/nodules Back: symmetric, no curvature. ROM normal. No CVA tenderness. Lungs: clear to auscultation bilaterally Heart: regular rate and rhythm, S1, S2 normal, no murmur, click, rub or gallop Abdomen: soft, non-tender; bowel sounds normal; no masses,  no organomegaly Pulses: 2+ and symmetric Skin: Skin color, texture, turgor normal. No rashes or lesions Lymph nodes: Cervical, supraclavicular, and axillary nodes normal.  No results found for: HGBA1C  Lab Results  Component Value Date   CREATININE 1.00  07/06/2014    Lab Results  Component Value Date   WBC 5.2 07/06/2014   HGB 14.4 07/06/2014   HCT 42.4 07/06/2014   PLT 296 07/06/2014   GLUCOSE 71 07/06/2014   CHOL 172 07/06/2014   TRIG 117 07/06/2014   HDL 37* 07/06/2014   LDLCALC 112* 07/06/2014   ALT 31 07/06/2014   AST 22 07/06/2014   NA 141 07/06/2014   K 4.5 07/06/2014   CL 101 07/06/2014   CREATININE 1.00 07/06/2014   BUN 13 07/06/2014   CO2 30 07/06/2014   TSH 1.420 07/06/2014   PSA 1.22  07/06/2014    No results found.  Assessment & Plan:   Problem List Items Addressed This Visit    Insomnia secondary to depression with anxiety - Primary    Did not toelrate buspirone,  And wants to avoid benzodiazepines.  Trial of trazodone and paxil,         A total of 25 minutes of face to face time was spent with patient more than half of which was spent in counselling about the above mentioned conditions  and coordination of care  I have discontinued Mr. Germond predniSONE, guaiFENesin-codeine, busPIRone, and chlorpheniramine-HYDROcodone. I am also having him start on traZODone and PARoxetine. Additionally, I am having him maintain his lisinopril, buPROPion, and albuterol.  Meds ordered this encounter  Medications  . traZODone (DESYREL) 50 MG tablet    Sig: Take 0.5-1 tablets (25-50 mg total) by mouth at bedtime as needed for sleep.    Dispense:  30 tablet    Refill:  3  . PARoxetine (PAXIL-CR) 12.5 MG 24 hr tablet    Sig: Take 1 tablet (12.5 mg total) by mouth daily.    Dispense:  30 tablet    Refill:  1    Medications Discontinued During This Encounter  Medication Reason  . busPIRone (BUSPAR) 10 MG tablet Allergic reaction  . predniSONE (DELTASONE) 10 MG tablet Completed Course  . guaiFENesin-codeine (CHERATUSSIN AC) 100-10 MG/5ML syrup Completed Course  . chlorpheniramine-HYDROcodone (TUSSIONEX PENNKINETIC ER) 10-8 MG/5ML SUER Completed Course    Follow-up: No Follow-up on file.   Sherlene Shams, MD

## 2015-05-23 NOTE — Patient Instructions (Addendum)
I am adding trazodone for treatment of insomnia,  And low dose paxil for daytime anxiety.  You can increase the paxil to 2 tablets after 2 weeks if you are not having any side effects,  And let me know so I can send a new rx to your pharmacy   Trazodone tablets What is this medicine? TRAZODONE (TRAZ oh done) is used to treat depression. This medicine may be used for other purposes; ask your health care provider or pharmacist if you have questions. What should I tell my health care provider before I take this medicine? They need to know if you have any of these conditions: -attempted suicide or thinking about it -bipolar disorder -bleeding problems -glaucoma -heart disease, or previous heart attack -irregular heart beat -kidney or liver disease -low levels of sodium in the blood -an unusual or allergic reaction to trazodone, other medicines, foods, dyes or preservatives -pregnant or trying to get pregnant -breast-feeding How should I use this medicine? Take this medicine by mouth with a glass of water. Follow the directions on the prescription label. Take this medicine shortly after a meal or a light snack. Take your medicine at regular intervals. Do not take your medicine more often than directed. Do not stop taking this medicine suddenly except upon the advice of your doctor. Stopping this medicine too quickly may cause serious side effects or your condition may worsen. A special MedGuide will be given to you by the pharmacist with each prescription and refill. Be sure to read this information carefully each time. Talk to your pediatrician regarding the use of this medicine in children. Special care may be needed. Overdosage: If you think you have taken too much of this medicine contact a poison control center or emergency room at once. NOTE: This medicine is only for you. Do not share this medicine with others. What if I miss a dose? If you miss a dose, take it as soon as you can. If it  is almost time for your next dose, take only that dose. Do not take double or extra doses. What may interact with this medicine? Do not take this medicine with any of the following medications: -certain medicines for fungal infections like fluconazole, itraconazole, ketoconazole, posaconazole, voriconazole -cisapride -dofetilide -dronedarone -linezolid -MAOIs like Carbex, Eldepryl, Marplan, Nardil, and Parnate -mesoridazine -methylene blue (injected into a vein) -pimozide -saquinavir -thioridazine -ziprasidone This medicine may also interact with the following medications: -alcohol -antiviral medicines for HIV or AIDS -aspirin and aspirin-like medicines -barbiturates like phenobarbital -certain medicines for blood pressure, heart disease, irregular heart beat -certain medicines for depression, anxiety, or psychotic disturbances -certain medicines for migraine headache like almotriptan, eletriptan, frovatriptan, naratriptan, rizatriptan, sumatriptan, zolmitriptan -certain medicines for seizures like carbamazepine and phenytoin -certain medicines for sleep -certain medicines that treat or prevent blood clots like dalteparin, enoxaparin, warfarin -digoxin -fentanyl -lithium -NSAIDS, medicines for pain and inflammation, like ibuprofen or naproxen -other medicines that prolong the QT interval (cause an abnormal heart rhythm) -rasagiline -supplements like St. John's wort, kava kava, valerian -tramadol -tryptophan This list may not describe all possible interactions. Give your health care provider a list of all the medicines, herbs, non-prescription drugs, or dietary supplements you use. Also tell them if you smoke, drink alcohol, or use illegal drugs. Some items may interact with your medicine. What should I watch for while using this medicine? Tell your doctor if your symptoms do not get better or if they get worse. Visit your doctor or health care professional for  regular checks on  your progress. Because it may take several weeks to see the full effects of this medicine, it is important to continue your treatment as prescribed by your doctor. Patients and their families should watch out for new or worsening thoughts of suicide or depression. Also watch out for sudden changes in feelings such as feeling anxious, agitated, panicky, irritable, hostile, aggressive, impulsive, severely restless, overly excited and hyperactive, or not being able to sleep. If this happens, especially at the beginning of treatment or after a change in dose, call your health care professional. Bonita Quin may get drowsy or dizzy. Do not drive, use machinery, or do anything that needs mental alertness until you know how this medicine affects you. Do not stand or sit up quickly, especially if you are an older patient. This reduces the risk of dizzy or fainting spells. Alcohol may interfere with the effect of this medicine. Avoid alcoholic drinks. This medicine may cause dry eyes and blurred vision. If you wear contact lenses you may feel some discomfort. Lubricating drops may help. See your eye doctor if the problem does not go away or is severe. Your mouth may get dry. Chewing sugarless gum, sucking hard candy and drinking plenty of water may help. Contact your doctor if the problem does not go away or is severe. What side effects may I notice from receiving this medicine? Side effects that you should report to your doctor or health care professional as soon as possible: -allergic reactions like skin rash, itching or hives, swelling of the face, lips, or tongue -fast, irregular heartbeat -feeling faint or lightheaded, falls -painful erections or other sexual dysfunction -suicidal thoughts or other mood changes -trembling Side effects that usually do not require medical attention (report to your doctor or health care professional if they continue or are bothersome): -constipation -headache -muscle aches or  pains -nausea, vomiting -unusually weak or tired This list may not describe all possible side effects. Call your doctor for medical advice about side effects. You may report side effects to FDA at 1-800-FDA-1088. Where should I keep my medicine? Keep out of the reach of children. Store at room temperature between 15 and 30 degrees C (59 to 86 degrees F). Protect from light. Keep container tightly closed. Throw away any unused medicine after the expiration date. NOTE: This sheet is a summary. It may not cover all possible information. If you have questions about this medicine, talk to your doctor, pharmacist, or health care provider.    2016, Elsevier/Gold Standard. (2012-10-17 15:46:28)

## 2015-05-25 NOTE — Assessment & Plan Note (Addendum)
Did not toelrate buspirone,  And wants to avoid benzodiazepines.  Trial of trazodone and paxil,

## 2015-07-17 DIAGNOSIS — F419 Anxiety disorder, unspecified: Secondary | ICD-10-CM | POA: Diagnosis not present

## 2015-07-23 DIAGNOSIS — F419 Anxiety disorder, unspecified: Secondary | ICD-10-CM | POA: Diagnosis not present

## 2015-07-24 DIAGNOSIS — F330 Major depressive disorder, recurrent, mild: Secondary | ICD-10-CM | POA: Diagnosis not present

## 2015-07-24 DIAGNOSIS — F411 Generalized anxiety disorder: Secondary | ICD-10-CM | POA: Diagnosis not present

## 2015-07-29 DIAGNOSIS — F419 Anxiety disorder, unspecified: Secondary | ICD-10-CM | POA: Diagnosis not present

## 2015-08-06 DIAGNOSIS — F419 Anxiety disorder, unspecified: Secondary | ICD-10-CM | POA: Diagnosis not present

## 2015-08-08 DIAGNOSIS — F411 Generalized anxiety disorder: Secondary | ICD-10-CM | POA: Diagnosis not present

## 2015-08-08 DIAGNOSIS — F330 Major depressive disorder, recurrent, mild: Secondary | ICD-10-CM | POA: Diagnosis not present

## 2015-08-12 DIAGNOSIS — F419 Anxiety disorder, unspecified: Secondary | ICD-10-CM | POA: Diagnosis not present

## 2015-08-19 DIAGNOSIS — F419 Anxiety disorder, unspecified: Secondary | ICD-10-CM | POA: Diagnosis not present

## 2015-08-28 DIAGNOSIS — F419 Anxiety disorder, unspecified: Secondary | ICD-10-CM | POA: Diagnosis not present

## 2015-09-02 DIAGNOSIS — F330 Major depressive disorder, recurrent, mild: Secondary | ICD-10-CM | POA: Diagnosis not present

## 2015-09-02 DIAGNOSIS — F411 Generalized anxiety disorder: Secondary | ICD-10-CM | POA: Diagnosis not present

## 2015-09-20 DIAGNOSIS — R5383 Other fatigue: Secondary | ICD-10-CM | POA: Diagnosis not present

## 2015-09-20 DIAGNOSIS — I10 Essential (primary) hypertension: Secondary | ICD-10-CM | POA: Diagnosis not present

## 2015-09-20 DIAGNOSIS — E784 Other hyperlipidemia: Secondary | ICD-10-CM | POA: Diagnosis not present

## 2015-09-20 DIAGNOSIS — N400 Enlarged prostate without lower urinary tract symptoms: Secondary | ICD-10-CM | POA: Diagnosis not present

## 2015-09-20 DIAGNOSIS — E559 Vitamin D deficiency, unspecified: Secondary | ICD-10-CM | POA: Diagnosis not present

## 2015-09-20 DIAGNOSIS — F329 Major depressive disorder, single episode, unspecified: Secondary | ICD-10-CM | POA: Diagnosis not present

## 2015-09-20 DIAGNOSIS — F419 Anxiety disorder, unspecified: Secondary | ICD-10-CM | POA: Diagnosis not present

## 2015-10-17 ENCOUNTER — Ambulatory Visit (INDEPENDENT_AMBULATORY_CARE_PROVIDER_SITE_OTHER): Payer: BLUE CROSS/BLUE SHIELD | Admitting: Internal Medicine

## 2015-10-17 ENCOUNTER — Encounter: Payer: Self-pay | Admitting: Internal Medicine

## 2015-10-17 VITALS — BP 140/90 | HR 75 | Temp 98.5°F | Resp 12 | Ht 73.0 in | Wt 221.0 lb

## 2015-10-17 DIAGNOSIS — M25511 Pain in right shoulder: Secondary | ICD-10-CM

## 2015-10-17 DIAGNOSIS — L089 Local infection of the skin and subcutaneous tissue, unspecified: Secondary | ICD-10-CM

## 2015-10-17 DIAGNOSIS — M25512 Pain in left shoulder: Secondary | ICD-10-CM

## 2015-10-17 DIAGNOSIS — Z125 Encounter for screening for malignant neoplasm of prostate: Secondary | ICD-10-CM | POA: Diagnosis not present

## 2015-10-17 DIAGNOSIS — F411 Generalized anxiety disorder: Secondary | ICD-10-CM

## 2015-10-17 DIAGNOSIS — E785 Hyperlipidemia, unspecified: Secondary | ICD-10-CM | POA: Diagnosis not present

## 2015-10-17 DIAGNOSIS — R5383 Other fatigue: Secondary | ICD-10-CM | POA: Diagnosis not present

## 2015-10-17 LAB — CBC WITH DIFFERENTIAL/PLATELET
Basophils Absolute: 0 10*3/uL (ref 0.0–0.1)
Basophils Relative: 0.3 % (ref 0.0–3.0)
Eosinophils Absolute: 0.2 10*3/uL (ref 0.0–0.7)
Eosinophils Relative: 3 % (ref 0.0–5.0)
HCT: 41.4 % (ref 39.0–52.0)
Hemoglobin: 13.8 g/dL (ref 13.0–17.0)
Lymphocytes Relative: 34.2 % (ref 12.0–46.0)
Lymphs Abs: 2 10*3/uL (ref 0.7–4.0)
MCHC: 33.4 g/dL (ref 30.0–36.0)
MCV: 87.4 fl (ref 78.0–100.0)
Monocytes Absolute: 0.6 10*3/uL (ref 0.1–1.0)
Monocytes Relative: 10.7 % (ref 3.0–12.0)
Neutro Abs: 3.1 10*3/uL (ref 1.4–7.7)
Neutrophils Relative %: 51.8 % (ref 43.0–77.0)
Platelets: 272 10*3/uL (ref 150.0–400.0)
RBC: 4.74 Mil/uL (ref 4.22–5.81)
RDW: 12.9 % (ref 11.5–15.5)
WBC: 5.9 10*3/uL (ref 4.0–10.5)

## 2015-10-17 LAB — LIPID PANEL
Cholesterol: 166 mg/dL (ref 0–200)
HDL: 44.4 mg/dL (ref >39.00)
LDL Cholesterol: 105 mg/dL — ABNORMAL HIGH (ref 0–99)
NonHDL: 121.77
Total CHOL/HDL Ratio: 4
Triglycerides: 86 mg/dL (ref 0.0–149.0)
VLDL: 17.2 mg/dL (ref 0.0–40.0)

## 2015-10-17 LAB — COMPREHENSIVE METABOLIC PANEL
ALT: 34 U/L (ref 0–53)
AST: 33 U/L (ref 0–37)
Albumin: 4.7 g/dL (ref 3.5–5.2)
Alkaline Phosphatase: 39 U/L (ref 39–117)
BUN: 19 mg/dL (ref 6–23)
CO2: 29 mEq/L (ref 19–32)
Calcium: 9.8 mg/dL (ref 8.4–10.5)
Chloride: 104 mEq/L (ref 96–112)
Creatinine, Ser: 1 mg/dL (ref 0.40–1.50)
GFR: 83.17 mL/min (ref >60.00)
Glucose, Bld: 91 mg/dL (ref 70–99)
Potassium: 4.3 mEq/L (ref 3.5–5.1)
Sodium: 140 mEq/L (ref 135–145)
Total Bilirubin: 0.3 mg/dL (ref 0.2–1.2)
Total Protein: 8 g/dL (ref 6.0–8.3)

## 2015-10-17 LAB — TSH: TSH: 2.32 u[IU]/mL (ref 0.35–4.50)

## 2015-10-17 LAB — PSA: PSA: 1.14 ng/mL (ref 0.10–4.00)

## 2015-10-17 MED ORDER — CEPHALEXIN 500 MG PO CAPS: 500.0000 mg | ORAL_CAPSULE | Freq: Four times a day (QID) | ORAL | Status: DC

## 2015-10-17 MED ORDER — TRAMADOL HCL 50 MG PO TABS: 50.0000 mg | ORAL_TABLET | Freq: Three times a day (TID) | ORAL | Status: DC | PRN

## 2015-10-17 NOTE — Progress Notes (Signed)
Subjective:  Patient ID: Kevin Barry, male    DOB: [birth year]  Age: 55 y.o. MRN: [id number]  CC: The primary encounter diagnosis was Other fatigue. Diagnoses of Hyperlipemia, Prostate cancer screening, Superficial skin infection, Anxiety state, and Pain of both shoulder joints were also pertinent to this visit.  HPI Kevin Barry presents for multiple issues   1) shoulder pain, bilateral   Radiates to hands , hands feel swollen in am and cannot make a fist.  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  Tramadol helped. Inside of elbows get sore,  Wrists not sore.    lower back pain    3) recurrent blistering trash on forearm after cactus scratch   4) sexual side effects of zoloft.  Seeing Dr Maryruth Bun. For GAD managred with zoloft and buspirone   Outpatient Prescriptions Prior to Visit  Medication Sig Dispense Refill  . albuterol (PROVENTIL HFA;VENTOLIN HFA) 108 (90 Base) MCG/ACT inhaler Inhale 2 puffs into the lungs every 6 (six) hours as needed for wheezing or shortness of breath. 3.7 g 0  . lisinopril (PRINIVIL,ZESTRIL) 5 MG tablet Take 5 mg by mouth daily.    . traZODone (DESYREL) 50 MG tablet Take 0.5-1 tablets (25-50 mg total) by mouth at bedtime as needed for sleep. 30 tablet 3  . buPROPion (WELLBUTRIN SR) 150 MG 12 hr tablet Take 150 mg by mouth 2 (two) times daily. Reported on 10/17/2015    . PARoxetine (PAXIL-CR) 12.5 MG 24 hr tablet Take 1 tablet (12.5 mg total) by mouth daily. (Patient not taking: Reported on 10/17/2015) 30 tablet 1   No facility-administered medications prior to visit.    Review of Systems;  Patient denies headache, fevers, malaise, unintentional weight loss, skin rash, eye pain, sinus congestion and sinus pain, sore throat, dysphagia,  hemoptysis , cough, dyspnea, wheezing, chest pain, palpitations, orthopnea, edema, abdominal pain, nausea, melena, diarrhea, constipation, flank pain, dysuria, hematuria, urinary   Frequency, nocturia, numbness, tingling, seizures,  Focal weakness, Loss of consciousness,  Tremor, insomnia, depression, anxiety, and suicidal ideation.      Objective:  BP 140/90 mmHg  Pulse 75  Temp(Src) 98.5 F (36.9 C) (Oral)  Resp 12  Ht  (1.854 m)  Wt 221 lb (100.245 kg)  BMI 29.16 kg/m2  SpO2 96%  BP Readings from Last 3 Encounters:  10/17/15 140/90  05/23/15 112/70  04/22/15 122/84    Wt Readings from Last 3 Encounters:  10/17/15 221 lb (100.245 kg)  05/23/15 214 lb 8 oz (97.297 kg)  04/22/15 217 lb 2 oz (98.487 kg)    General appearance: alert, cooperative and appears stated age Neck: no adenopathy, no carotid bruit, supple, symmetrical, trachea midline and thyroid not enlarged, symmetric, no tenderness/mass/nodules Back: symmetric, no curvature. ROM normal. No CVA tenderness. Lungs: clear to auscultation bilaterally Heart: regular rate and rhythm, S1, S2 normal, no murmur, click, rub or gallop Abdomen: soft, non-tender; bowel sounds normal; no masses,  no organomegaly Pulses: 2+ and symmetric Skin: left forearm with linear papular erythematous rash  Lymph nodes: Cervical, supraclavicular, and axillary nodes normal.  No results found for: HGBA1C  Lab Results  Component Value Date   CREATININE 1.00 10/17/2015   CREATININE 1.00 07/06/2014    Lab Results  Component Value Date   WBC 5.9 10/17/2015   HGB 13.8 10/17/2015   HCT 41.4 10/17/2015   PLT 272.0 10/17/2015   GLUCOSE 91 10/17/2015   CHOL 166 10/17/2015  TRIG 86.0 10/17/2015   HDL 44.40 10/17/2015   LDLCALC 105* 10/17/2015   ALT 34 10/17/2015   AST 33 10/17/2015   NA 140 10/17/2015   K 4.3 10/17/2015   CL 104 10/17/2015   CREATININE 1.00 10/17/2015   BUN 19 10/17/2015   CO2 29 10/17/2015   TSH 2.32 10/17/2015   PSA 1.14 10/17/2015    No results found.  Assessment & Plan:   Problem List Items Addressed This Visit    Superficial skin infection    Keflex prescribed for skin  infection from cactus scratch      Relevant Medications   cephALEXin (KEFLEX) 500 MG capsule   Anxiety state    Discussed use of buspirone to help relax       Relevant Medications   busPIRone (BUSPAR) 10 MG tablet   sertraline (ZOLOFT) 50 MG tablet   Pain in joint, shoulder region    bursitis secondary to overuse,  Trial of meloxicam and tramadol       Other Visit Diagnoses    Other fatigue    -  Primary    Relevant Orders    Comprehensive metabolic panel (Completed)    TSH (Completed)    CBC with Differential/Platelet (Completed)    Hyperlipemia        Relevant Orders    Lipid panel (Completed)    Prostate cancer screening        Relevant Orders    PSA (Completed)      A total of 25 minutes of face to face time was spent with patient more than half of which was spent in counselling about the above mentioned conditions  and coordination of care  I have discontinued Mr. Lacie DraftLevy's PARoxetine. I am also having him start on traMADol and cephALEXin. Additionally, I am having him maintain his lisinopril, buPROPion, albuterol, traZODone, busPIRone, sertraline, and ibuprofen.  Meds ordered this encounter  Medications  . busPIRone (BUSPAR) 10 MG tablet    Sig: Take 1 tablet by mouth 2 (two) times daily.    Refill:  1  . sertraline (ZOLOFT) 50 MG tablet    Sig: Take 1 tablet by mouth daily.    Refill:  1  . ibuprofen (ADVIL,MOTRIN) 200 MG tablet    Sig: Take 600 mg by mouth every 4 (four) hours as needed.  . traMADol (ULTRAM) 50 MG tablet    Sig: Take 1 tablet (50 mg total) by mouth every 8 (eight) hours as needed.    Dispense:  120 tablet    Refill:  0  . cephALEXin (KEFLEX) 500 MG capsule    Sig: Take 1 capsule (500 mg total) by mouth 4 (four) times daily.    Dispense:  28 capsule    Refill:  0    Medications Discontinued During This Encounter  Medication Reason  . PARoxetine (PAXIL-CR) 12.5 MG 24 hr tablet     Follow-up: No Follow-up on file.   Sherlene ShamsULLO, Remmy Riffe L,  MD

## 2015-10-17 NOTE — Patient Instructions (Addendum)
For the skin infection: Keflex  Every 6 hours for one week.  Notify me if you develop a rash  Please take a probiotic ( Align, Floraque or Culturelle) of the generic version of one of these  For a minimum of 3 weeks to prevent a serious antibiotic associated diarrhea  Called clostridium dificile colitis   Try taking a dose of buspirone in the evening prior to intercourse to see if it helps you climax   Your swelling may be due to ibuprofen .  Try using the once daily meloxicam instead,  And you can add tylenol and tramadol as needed (maxium 4 tylenol and 4 tramadol daily)   Keep an eye on your BP.  If it continues to be > 130/80, suspend the meloxicam and recheck

## 2015-10-17 NOTE — Progress Notes (Signed)
Pre-visit discussion using our clinic review tool. No additional management support is needed unless otherwise documented below in the visit note.  

## 2015-10-20 ENCOUNTER — Encounter: Payer: Self-pay | Admitting: Internal Medicine

## 2015-10-20 DIAGNOSIS — F411 Generalized anxiety disorder: Secondary | ICD-10-CM | POA: Insufficient documentation

## 2015-10-20 DIAGNOSIS — L089 Local infection of the skin and subcutaneous tissue, unspecified: Secondary | ICD-10-CM | POA: Insufficient documentation

## 2015-10-20 DIAGNOSIS — M25519 Pain in unspecified shoulder: Secondary | ICD-10-CM | POA: Insufficient documentation

## 2015-10-20 NOTE — Assessment & Plan Note (Addendum)
bursitis secondary to overuse,  Trial of meloxicam and tramadol

## 2015-10-20 NOTE — Assessment & Plan Note (Signed)
Keflex prescribed for skin infection from cactus scratch

## 2015-10-20 NOTE — Assessment & Plan Note (Signed)
Discussed use of buspirone to help relax

## 2015-10-25 ENCOUNTER — Encounter: Payer: Self-pay | Admitting: Family Medicine

## 2015-10-25 ENCOUNTER — Ambulatory Visit (INDEPENDENT_AMBULATORY_CARE_PROVIDER_SITE_OTHER): Payer: BLUE CROSS/BLUE SHIELD | Admitting: Family Medicine

## 2015-10-25 DIAGNOSIS — F411 Generalized anxiety disorder: Secondary | ICD-10-CM | POA: Diagnosis not present

## 2015-10-25 DIAGNOSIS — R21 Rash and other nonspecific skin eruption: Secondary | ICD-10-CM | POA: Diagnosis not present

## 2015-10-25 DIAGNOSIS — F330 Major depressive disorder, recurrent, mild: Secondary | ICD-10-CM | POA: Diagnosis not present

## 2015-10-25 MED ORDER — PREDNISONE 10 MG PO TABS: ORAL_TABLET | ORAL | 0 refills | Status: DC

## 2015-10-25 NOTE — Assessment & Plan Note (Signed)
Established problem, persistent. Appears to be contact in nature. Discussed topical versus systemic steroids. Patient elected for systemic steroids. Treating with prednisone.

## 2015-10-25 NOTE — Progress Notes (Signed)
Pre visit review using our clinic review tool, if applicable. No additional management support is needed unless otherwise documented below in the visit note. 

## 2015-10-25 NOTE — Patient Instructions (Signed)
Take the prednisone as prescribed.  Follow up closely with Dr. Darrick Huntsman  Take care  Dr. Adriana Simas

## 2015-10-25 NOTE — Progress Notes (Signed)
   Subjective:  Patient ID: Kevin Barry, male    DOB: [birth year]  Age: 55 y.o. MRN: [id number]  CC: Rash  HPI:  55 year old male presents with complaints of rash.  Patient was recent seen for rash by his primary physician. He was placed on Keflex.  He presents today with complaints of continued rash. He states it is actually worse. Rashes located on his left arm, right arm and right leg. He reports associated redness and itching. He's had some improvement with Keflex. No reports of drainage. No fever.  Social Hx   Social History   Social History  . Marital status: Married    Spouse name: N/A  . Number of children: N/A  . Years of education: N/A   Social History Main Topics  . Smoking status: Never Smoker  . Smokeless tobacco: Never Used  . Alcohol use Yes     Comment: maybe once a week  . Drug use: No  . Sexual activity: Not Asked   Other Topics Concern  . None   Social History Narrative  . None   Review of Systems  Constitutional: Negative.   Skin: Positive for rash.   Objective:  BP 132/88 (BP Location: Right Arm, Patient Position: Sitting, Cuff Size: Normal)   Pulse 82   Temp 98.1 F (36.7 C) (Oral)   Wt 223 lb (101.2 kg)   SpO2 98%   BMI 29.42 kg/m   BP/Weight 10/25/2015 10/17/2015 05/23/2015  Systolic BP 132 140 112  Diastolic BP 88 90 70  Wt. (Lbs) 223 221 214.5  BMI 29.42 29.16 28.31   Physical Exam  Constitutional: He is oriented to person, place, and time. He appears well-developed. No distress.  Pulmonary/Chest: Effort normal.  Neurological: He is alert and oriented to person, place, and time.  Skin:  Erythematous papular rash. Some areas appear consistent with poison ivy dermatitis.  Psychiatric: He has a normal mood and affect.  Vitals reviewed.  Lab Results  Component Value Date   WBC 5.9 10/17/2015   HGB 13.8 10/17/2015   HCT 41.4 10/17/2015   PLT 272.0 10/17/2015   GLUCOSE 91 10/17/2015   CHOL 166 10/17/2015   TRIG 86.0 10/17/2015   HDL 44.40 10/17/2015   LDLCALC 105 (H) 10/17/2015   ALT 34 10/17/2015   AST 33 10/17/2015   NA 140 10/17/2015   K 4.3 10/17/2015   CL 104 10/17/2015   CREATININE 1.00 10/17/2015   BUN 19 10/17/2015   CO2 29 10/17/2015   TSH 2.32 10/17/2015   PSA 1.14 10/17/2015   Assessment & Plan:   Problem List Items Addressed This Visit    Rash    Established problem, persistent. Appears to be contact in nature. Discussed topical versus systemic steroids. Patient elected for systemic steroids. Treating with prednisone.       Other Visit Diagnoses   None.    Meds ordered this encounter  Medications  . predniSONE (DELTASONE) 10 MG tablet    Sig: 50 mg (5 tablets) daily x 2 days, then 40 mg (4 tablets) daily x 2 days, then 30 mg (3 tablets) daily x 2 days, then 20 mg (2 tablets) daily x 2 days, then 10 mg (1 tablet) daily x 2 days.    Dispense:  30 tablet    Refill:  0   Follow-up: PRN  Everlene Other DO [facility]

## 2015-11-30 ENCOUNTER — Other Ambulatory Visit: Payer: Self-pay | Admitting: Internal Medicine

## 2015-12-02 ENCOUNTER — Other Ambulatory Visit: Payer: Self-pay | Admitting: Internal Medicine

## 2015-12-03 NOTE — Telephone Encounter (Signed)
Patient requesting refills on tramadol and meloxicam. Last OV 10/17/15 last refill on tramadol 10/17/15 for 120 and no refills.

## 2015-12-03 NOTE — Telephone Encounter (Signed)
Last refill #120 and last OV  7/20. Ok to refill?

## 2015-12-04 MED ORDER — MELOXICAM 15 MG PO TABS: 15.0000 mg | ORAL_TABLET | Freq: Every day | ORAL | 1 refills | Status: DC

## 2015-12-04 MED ORDER — TRAMADOL HCL 50 MG PO TABS: 50.0000 mg | ORAL_TABLET | Freq: Three times a day (TID) | ORAL | 0 refills | Status: DC | PRN

## 2015-12-04 NOTE — Telephone Encounter (Signed)
Duplicate request

## 2015-12-04 NOTE — Telephone Encounter (Signed)
Refills done/authorized

## 2015-12-04 NOTE — Telephone Encounter (Signed)
Rx faxed to [facility] [address] 

## 2015-12-04 NOTE — Telephone Encounter (Signed)
Last fill of Tramadol on 10/17/15. See message attached with Tramadol request. Pt also wanting Meloxicam.  Last seen 10/17/15.

## 2016-01-23 ENCOUNTER — Ambulatory Visit (INDEPENDENT_AMBULATORY_CARE_PROVIDER_SITE_OTHER): Payer: BLUE CROSS/BLUE SHIELD | Admitting: Family Medicine

## 2016-01-23 ENCOUNTER — Encounter: Payer: Self-pay | Admitting: Family Medicine

## 2016-01-23 DIAGNOSIS — L0291 Cutaneous abscess, unspecified: Secondary | ICD-10-CM | POA: Diagnosis not present

## 2016-01-23 MED ORDER — DOXYCYCLINE HYCLATE 100 MG PO TABS: 100.0000 mg | ORAL_TABLET | Freq: Two times a day (BID) | ORAL | 0 refills | Status: DC

## 2016-01-23 NOTE — Progress Notes (Signed)
    Subjective:  Patient ID: Kevin Barry, male    DOB: 03/03/1961  Age: 55 y.o. MRN: [id number]  CC: ? Ingrown hair  HPI:  55 year old male presents with the above complaint.  Patient states that 4-5 days ago he noticed a bump on his right cheek just above the beard line. It subsequently became red and painful. It has now drained some purulent fluid. He reports the pain is moderate to severe. He's been using hydrogen peroxide and cleaning the area well. No associated fevers or chills. No known exacerbating factors. No other complaints at this time.  Social Hx   Social History   Social History  . Marital status: Married    Spouse name: N/A  . Number of children: N/A  . Years of education: N/A   Social History Main Topics  . Smoking status: Never Smoker  . Smokeless tobacco: Never Used  . Alcohol use Yes     Comment: maybe once a week  . Drug use: No  . Sexual activity: Not Asked   Other Topics Concern  . None   Social History Narrative  . None    Review of Systems  Constitutional: Positive for fatigue.  Skin:       Area of redness, w/ drainage (Right cheek).   Objective:  BP (!) 146/98 (BP Location: Right Arm, Patient Position: Sitting, Cuff Size: Normal)   Pulse 85   Temp 97.6 F (36.4 C) (Oral)   Wt 217 lb 8 oz (98.7 kg)   SpO2 95%   BMI 28.70 kg/m   BP/Weight 01/23/2016 10/25/2015 10/17/2015  Systolic BP 146 132 140  Diastolic BP 98 88 90  Wt. (Lbs) 217.5 223 221  BMI 28.7 29.42 29.16   Physical Exam  Constitutional: He is oriented to person, place, and time. He appears well-developed. No distress.  Pulmonary/Chest: Effort normal.  Neurological: He is alert and oriented to person, place, and time.  Skin:     Right cheek at the level of the maxilla, small erythematous area with underlying induration (approximately the size of a quarter). No current drainage. Tender to palpation.  Psychiatric: He has a normal mood and affect.  Vitals reviewed.  Lab  Results  Component Value Date   WBC 5.9 10/17/2015   HGB 13.8 10/17/2015   HCT 41.4 10/17/2015   PLT 272.0 10/17/2015   GLUCOSE 91 10/17/2015   CHOL 166 10/17/2015   TRIG 86.0 10/17/2015   HDL 44.40 10/17/2015   LDLCALC 105 (H) 10/17/2015   ALT 34 10/17/2015   AST 33 10/17/2015   NA 140 10/17/2015   K 4.3 10/17/2015   CL 104 10/17/2015   CREATININE 1.00 10/17/2015   BUN 19 10/17/2015   CO2 29 10/17/2015   TSH 2.32 10/17/2015   PSA 1.14 10/17/2015   Assessment & Plan:   Problem List Items Addressed This Visit    Abscess    New problem. Treating with doxycycline. Advised warm compresses. If not improving, urged to contact us urgently as this may need to be addressed surgically.       Other Visit Diagnoses   None.    Meds ordered this encounter  Medications  . doxycycline (VIBRA-TABS) 100 MG tablet    Sig: Take 1 tablet (100 mg total) by mouth 2 (two) times daily.    Dispense:  20 tablet    Refill:  0   Follow-up: PRN  Everlene OtherJayce Breanah Faddis DO [facility] [place]

## 2016-01-23 NOTE — Assessment & Plan Note (Signed)
New problem. Treating with doxycycline. Advised warm compresses. If not improving, urged to contact us urgently as this may need to be addressed surgically.

## 2016-01-23 NOTE — Patient Instructions (Signed)
Take the antibiotic twice daily with food.  If this worsens or does not improve please let us know quickly.  Take care  Dr. Adriana Simasook

## 2016-01-23 NOTE — Progress Notes (Signed)
Pre visit review using our clinic review tool, if applicable. No additional management support is needed unless otherwise documented below in the visit note. 

## 2016-01-30 ENCOUNTER — Encounter: Payer: Self-pay | Admitting: Internal Medicine

## 2016-01-30 ENCOUNTER — Ambulatory Visit (INDEPENDENT_AMBULATORY_CARE_PROVIDER_SITE_OTHER): Payer: BLUE CROSS/BLUE SHIELD | Admitting: Internal Medicine

## 2016-01-30 VITALS — BP 150/84 | HR 69 | Temp 97.5°F | Resp 12 | Ht 73.0 in | Wt 213.8 lb

## 2016-01-30 DIAGNOSIS — F411 Generalized anxiety disorder: Secondary | ICD-10-CM | POA: Diagnosis not present

## 2016-01-30 DIAGNOSIS — I10 Essential (primary) hypertension: Secondary | ICD-10-CM

## 2016-01-30 DIAGNOSIS — Z23 Encounter for immunization: Secondary | ICD-10-CM | POA: Diagnosis not present

## 2016-01-30 DIAGNOSIS — M25512 Pain in left shoulder: Secondary | ICD-10-CM

## 2016-01-30 DIAGNOSIS — Z8249 Family history of ischemic heart disease and other diseases of the circulatory system: Secondary | ICD-10-CM | POA: Insufficient documentation

## 2016-01-30 DIAGNOSIS — M25511 Pain in right shoulder: Secondary | ICD-10-CM | POA: Diagnosis not present

## 2016-01-30 NOTE — Patient Instructions (Addendum)
Suspend the meloxicam   Use up to 2000 mg tylenol and tramadol up to 4 daily if needed for pain control.  NO NSAIDS.  If BP is not 120/70 or less after 72 hours,  Increase your lisinopril to 10 mg daily  And return for non fasting lab (BMET) after one week of higher dose.    [email or web address] for compression sweat socks   Referral to [facility] for cardiac evaluation with cardiac CT

## 2016-01-30 NOTE — Progress Notes (Signed)
Pre-visit discussion using our clinic review tool. No additional management support is needed unless otherwise documented below in the visit note.  

## 2016-01-30 NOTE — Assessment & Plan Note (Signed)
Managed with wellbutrin and Buspar.

## 2016-01-30 NOTE — Assessment & Plan Note (Signed)
Patient advised to suspend NSAID and if BP is still > 120/70  After 3 dys,  To increase lisinopril to 10 mg daily  Repeat BMEt one week after medication change.

## 2016-01-30 NOTE — Progress Notes (Signed)
Subjective:  Patient ID: Kevin Barry, male    DOB: 02/13/1961  Age: 55 y.o. MRN: [id number]  CC: The primary encounter diagnosis was Family history of early CAD. Diagnoses of Essential hypertension, Pain of both shoulder joints, and Anxiety state were also pertinent to this visit.  HPI Kevin Barry presents for follow up on hypertension noted at last visit to be elevated on low dose lisinopril with concurrent use of advil. At maximal dose of 2400 mg daily  meds changed to tramadol and meloxicam. Home BPs still elevated    Anxiety better but still has a feeling of disease.  Father died at 3856 of massive MI and was  Very healthy , nonsmoker. Mother had emergent aortic valve replacement  at 7961 ,  Grandfather had a stroke at 8873      Discussed cardiology eval  Using buspar 1-2 times daily   wellbutrin once daily per Dr Maryruth BunKapur.    losing weight intentionally goal 207    Outpatient Medications Prior to Visit  Medication Sig Dispense Refill  . buPROPion (WELLBUTRIN SR) 150 MG 12 hr tablet Take 150 mg by mouth 2 (two) times daily. Reported on 10/17/2015    . busPIRone (BUSPAR) 10 MG tablet Take 1 tablet by mouth 2 (two) times daily.  1  . doxycycline (VIBRA-TABS) 100 MG tablet Take 1 tablet (100 mg total) by mouth 2 (two) times daily. 20 tablet 0  . lisinopril (PRINIVIL,ZESTRIL) 5 MG tablet Take 5 mg by mouth daily.    . meloxicam (MOBIC) 15 MG tablet Take 1 tablet (15 mg total) by mouth daily. 90 tablet 1  . traMADol (ULTRAM) 50 MG tablet Take 1 tablet (50 mg total) by mouth every 8 (eight) hours as needed. 120 tablet 0  . traZODone (DESYREL) 50 MG tablet Take 0.5-1 tablets (25-50 mg total) by mouth at bedtime as needed for sleep. 30 tablet 3  . albuterol (PROVENTIL HFA;VENTOLIN HFA) 108 (90 Base) MCG/ACT inhaler Inhale 2 puffs into the lungs every 6 (six) hours as needed for wheezing or shortness of breath. (Patient not taking: Reported on 01/30/2016) 3.7 g 0  . ibuprofen (ADVIL,MOTRIN) 200 MG  tablet Take 600 mg by mouth every 4 (four) hours as needed.    . sertraline (ZOLOFT) 50 MG tablet Take 1 tablet by mouth daily.  1   No facility-administered medications prior to visit.     Review of Systems;  Patient denies headache, fevers, malaise, unintentional weight loss, skin rash, eye pain, sinus congestion and sinus pain, sore throat, dysphagia,  hemoptysis , cough, dyspnea, wheezing, chest pain, palpitations, orthopnea, edema, abdominal pain, nausea, melena, diarrhea, constipation, flank pain, dysuria, hematuria, urinary  Frequency, nocturia, numbness, tingling, seizures,  Focal weakness, Loss of consciousness,  Tremor, insomnia, depression, anxiety, and suicidal ideation.      Objective:  BP (!) 150/84   Pulse 69   Temp 97.5 F (36.4 C) (Oral)   Resp 12   Ht 6\' 1"  (1.854 m)   Wt 213 lb 12 oz (97 kg)   SpO2 97%   BMI 28.20 kg/m   BP Readings from Last 3 Encounters:  01/30/16 (!) 150/84  01/23/16 (!) 146/98  10/25/15 132/88    Wt Readings from Last 3 Encounters:  01/30/16 213 lb 12 oz (97 kg)  01/23/16 217 lb 8 oz (98.7 kg)  10/25/15 223 lb (101.2 kg)    General appearance: alert, cooperative and appears stated age Ears: normal TM's and external ear canals both ears Throat:  lips, mucosa, and tongue normal; teeth and gums normal Neck: no adenopathy, no carotid bruit, supple, symmetrical, trachea midline and thyroid not enlarged, symmetric, no tenderness/mass/nodules Back: symmetric, no curvature. ROM normal. No CVA tenderness. Lungs: clear to auscultation bilaterally Heart: regular rate and rhythm, S1, S2 normal, no murmur, click, rub or gallop Abdomen: soft, non-tender; bowel sounds normal; no masses,  no organomegaly Pulses: 2+ and symmetric Skin: Skin color, texture, turgor normal. No rashes or lesions Lymph nodes: Cervical, supraclavicular, and axillary nodes normal.  No results found for: HGBA1C  Lab Results  Component Value Date   CREATININE 1.00  10/17/2015   CREATININE 1.00 07/06/2014    Lab Results  Component Value Date   WBC 5.9 10/17/2015   HGB 13.8 10/17/2015   HCT 41.4 10/17/2015   PLT 272.0 10/17/2015   GLUCOSE 91 10/17/2015   CHOL 166 10/17/2015   TRIG 86.0 10/17/2015   HDL 44.40 10/17/2015   LDLCALC 105 (H) 10/17/2015   ALT 34 10/17/2015   AST 33 10/17/2015   NA 140 10/17/2015   K 4.3 10/17/2015   CL 104 10/17/2015   CREATININE 1.00 10/17/2015   BUN 19 10/17/2015   CO2 29 10/17/2015   TSH 2.32 10/17/2015   PSA 1.14 10/17/2015    No results found.  Assessment & Plan:   Problem List Items Addressed This Visit    Essential hypertension    Patient advised to suspend NSAID and if BP is still > 120/70  After 3 dys,  To increase lisinopril to 10 mg daily  Repeat BMEt one week after medication change.        Relevant Orders   Basic metabolic panel   Anxiety state    Managed with wellbutrin and Buspar.       Pain in joint, shoulder region    Improved with daily meloxicam and prn tramadol,  But the elevation in BP may be due to NSAIDs.  Advised to use tylenol/tramadol , suspend NSAIDS until BP can be reevaluated       Family history of early CAD - Primary    He is anxious and uneasy as he approaches the age at which his father died suddenly of a massive MI , with no prior history of hyperlipidemia or toabacco abuse. Referral to cardiology for cardiac CT/stress testing.       Relevant Orders   Ambulatory referral to Cardiology    Other Visit Diagnoses   None.     I have discontinued Mr. Lacie DraftLevy's sertraline and ibuprofen. I am also having him maintain his lisinopril, buPROPion, albuterol, traZODone, busPIRone, traMADol, meloxicam, and doxycycline.  No orders of the defined types were placed in this encounter.   Medications Discontinued During This Encounter  Medication Reason  . sertraline (ZOLOFT) 50 MG tablet Patient Preference  . ibuprofen (ADVIL,MOTRIN) 200 MG tablet Error    Follow-up: No  Follow-up on file.   Sherlene ShamsULLO, TERESA L, MD

## 2016-01-30 NOTE — Assessment & Plan Note (Signed)
He is anxious and uneasy as he approaches the age at which his father died suddenly of a massive MI , with no prior history of hyperlipidemia or toabacco abuse. Referral to cardiology for cardiac CT/stress testing.

## 2016-01-30 NOTE — Assessment & Plan Note (Signed)
Improved with daily meloxicam and prn tramadol,  But the elevation in BP may be due to NSAIDs.  Advised to use tylenol/tramadol , suspend NSAIDS until BP can be reevaluated

## 2016-02-03 ENCOUNTER — Encounter: Payer: Self-pay | Admitting: Internal Medicine

## 2016-02-04 ENCOUNTER — Ambulatory Visit (INDEPENDENT_AMBULATORY_CARE_PROVIDER_SITE_OTHER): Payer: BLUE CROSS/BLUE SHIELD | Admitting: Family Medicine

## 2016-02-04 ENCOUNTER — Encounter: Payer: Self-pay | Admitting: Family Medicine

## 2016-02-04 DIAGNOSIS — R21 Rash and other nonspecific skin eruption: Secondary | ICD-10-CM | POA: Diagnosis not present

## 2016-02-04 MED ORDER — CEPHALEXIN 500 MG PO CAPS: 500.0000 mg | ORAL_CAPSULE | Freq: Four times a day (QID) | ORAL | 0 refills | Status: DC

## 2016-02-04 MED ORDER — PREDNISONE 20 MG PO TABS: 40.0000 mg | ORAL_TABLET | Freq: Every day | ORAL | 0 refills | Status: DC

## 2016-02-04 NOTE — Patient Instructions (Signed)
Nice to see you. We are going to treat you for possible cellulitis and potential allergic skin reaction. We will treat with Keflex and prednisone. If you have spreading redness, develop fevers, chills, nausea, vomiting, or any new or changing symptoms please seek medical attention immediately.

## 2016-02-04 NOTE — Progress Notes (Signed)
  Marikay AlarEric Adrine Hayworth, MD Phone: [phone]  Kevin Barry is a 55 y.o. male who presents today for same day visit.  Patient notes 2 days ago he woke up with 3 small bug bites on the right side of his neck. Notes there has been progressive spreading of redness and warmth. Notes there has been itching in this area. He notes no pain in the area. He notes no throat swelling or shortness of breath. No fevers. He has a history of significant reactions to insect bites requiring prednisone and antibiotics in the past. He has tried topical anti-itch creams and topical steroids over-the-counter for this.  PMH: nonsmoker. Prior history of similar lesions with bug bites.   ROS see history of present illness  Objective  Physical Exam Vitals:   02/04/16 0933  BP: 132/88  Pulse: 83  Temp: 98.4 F (36.9 C)    BP Readings from Last 3 Encounters:  02/04/16 132/88  01/30/16 (!) 150/84  01/23/16 (!) 146/98   Wt Readings from Last 3 Encounters:  02/04/16 210 lb 12.8 oz (95.6 kg)  01/30/16 213 lb 12 oz (97 kg)  01/23/16 217 lb 8 oz (98.7 kg)    Physical Exam  Constitutional: He is well-developed, well-nourished, and in no distress.  HENT:  Head: Normocephalic and atraumatic.  Neck:    Cardiovascular: Normal rate, regular rhythm and normal heart sounds.   Pulmonary/Chest: Effort normal and breath sounds normal.  Neurological: He is alert. Gait normal.     Assessment/Plan: Please see individual problem list.  Rash and nonspecific skin eruption Patient with worsening rash over the last several days following 3 bug bites. Given the spreading redness the concern would be for cellulitis. Could also be localized allergic reaction to the insect bites. He notes no anaphylactic type symptoms. We will treat with prednisone for possible allergic component. We will treat with Keflex for possible infectious component. Patient has tolerated Keflex with no adverse effects previously. He will return in 2 days  for recheck. Given return precautions.   No orders of the defined types were placed in this encounter.   Meds ordered this encounter  Medications  . predniSONE (DELTASONE) 20 MG tablet    Sig: Take 2 tablets (40 mg total) by mouth daily with breakfast.    Dispense:  10 tablet    Refill:  0  . cephALEXin (KEFLEX) 500 MG capsule    Sig: Take 1 capsule (500 mg total) by mouth 4 (four) times daily.    Dispense:  28 capsule    Refill:  0    Marikay AlarEric Yurani Fettes, MD [facility]

## 2016-02-04 NOTE — Assessment & Plan Note (Signed)
Patient with worsening rash over the last several days following 3 bug bites. Given the spreading redness the concern would be for cellulitis. Could also be localized allergic reaction to the insect bites. He notes no anaphylactic type symptoms. We will treat with prednisone for possible allergic component. We will treat with Keflex for possible infectious component. Patient has tolerated Keflex with no adverse effects previously. He will return in 2 days for recheck. Given return precautions.

## 2016-02-04 NOTE — Progress Notes (Signed)
Pre visit review using our clinic review tool, if applicable. No additional management support is needed unless otherwise documented below in the visit note. 

## 2016-02-05 ENCOUNTER — Ambulatory Visit: Payer: BLUE CROSS/BLUE SHIELD | Admitting: Internal Medicine

## 2016-02-06 ENCOUNTER — Other Ambulatory Visit: Payer: Self-pay | Admitting: Internal Medicine

## 2016-02-06 ENCOUNTER — Encounter: Payer: Self-pay | Admitting: Family Medicine

## 2016-02-06 ENCOUNTER — Ambulatory Visit (INDEPENDENT_AMBULATORY_CARE_PROVIDER_SITE_OTHER): Payer: BLUE CROSS/BLUE SHIELD | Admitting: Family Medicine

## 2016-02-06 DIAGNOSIS — I10 Essential (primary) hypertension: Secondary | ICD-10-CM | POA: Diagnosis not present

## 2016-02-06 DIAGNOSIS — R21 Rash and other nonspecific skin eruption: Secondary | ICD-10-CM | POA: Diagnosis not present

## 2016-02-06 MED ORDER — LISINOPRIL 10 MG PO TABS: 10.0000 mg | ORAL_TABLET | Freq: Every day | ORAL | 1 refills | Status: DC

## 2016-02-06 NOTE — Assessment & Plan Note (Signed)
At goal today. He is given a refill of his lisinopril for 10 mg tablets. He will return next week as planned for lab work. I advised him to make a lab appointment.

## 2016-02-06 NOTE — Progress Notes (Signed)
  Marikay AlarEric Oziel Beitler, MD Phone: [phone]  Kevin Barry is a 55 y.o. male who presents today for f/u.  HYPERTENSION  Disease Monitoring  Home BP Monitoring 130s over 80s Chest pain- no    Dyspnea- no Medications  Compliance-  taking lisinopril 10 mg daily, just recently increased from 5-10 mg.  Edema- no  Rash: Patient seen 2 days ago for rash on his right neck. Felt to be either related to a localized allergic reaction to bug bites or cellulitis. He was treated with prednisone and Keflex. He notes this has improved. Itching is calm down significantly. No warmth to it. No fevers. No chills.    ROS see history of present illness  Objective  Physical Exam Vitals:   02/06/16 0820  BP: 118/76  Pulse: 78  Temp: 97.5 F (36.4 C)    BP Readings from Last 3 Encounters:  02/06/16 118/76  02/04/16 132/88  01/30/16 (!) 150/84   Wt Readings from Last 3 Encounters:  02/06/16 211 lb 3.2 oz (95.8 kg)  02/04/16 210 lb 12.8 oz (95.6 kg)  01/30/16 213 lb 12 oz (97 kg)    Physical Exam  Constitutional: He is well-developed, well-nourished, and in no distress.  Cardiovascular: Normal rate, regular rhythm and normal heart sounds.   Pulmonary/Chest: Effort normal and breath sounds normal.  Skin: Skin is warm and dry.  Rash on right neck significantly improved with much less erythema, there is no warmth or induration     Assessment/Plan: Please see individual problem list.  Rash and nonspecific skin eruption Much improved. Could be cellulitis or a localized allergic reaction to bug bites. He will complete the course of prednisone and Keflex. If he has change in symptoms or recurrence of symptoms after finishing medications he will let us know. Given return precautions.  Essential hypertension At goal today. He is given a refill of his lisinopril for 10 mg tablets. He will return next week as planned for lab work. I advised him to make a lab appointment.   No orders of the defined types  were placed in this encounter.   Meds ordered this encounter  Medications  . lisinopril (PRINIVIL,ZESTRIL) 10 MG tablet    Sig: Take 1 tablet (10 mg total) by mouth daily.    Dispense:  90 tablet    Refill:  1    Marikay AlarEric Gianmarco Roye, MD [facility]

## 2016-02-06 NOTE — Progress Notes (Signed)
Pre visit review using our clinic review tool, if applicable. No additional management support is needed unless otherwise documented below in the visit note. 

## 2016-02-06 NOTE — Patient Instructions (Signed)
Nice to see you. I have refilled your lisinopril. Please make a lab appointment for next week for recheck of kidney function. Please complete the antibiotics and prednisone. If you develop spreading redness, fevers, chills, or any new or change in symptoms please seek medical attention.

## 2016-02-06 NOTE — Assessment & Plan Note (Signed)
Much improved. Could be cellulitis or a localized allergic reaction to bug bites. He will complete the course of prednisone and Keflex. If he has change in symptoms or recurrence of symptoms after finishing medications he will let us know. Given return precautions.

## 2016-02-07 DIAGNOSIS — F411 Generalized anxiety disorder: Secondary | ICD-10-CM | POA: Diagnosis not present

## 2016-02-07 DIAGNOSIS — F330 Major depressive disorder, recurrent, mild: Secondary | ICD-10-CM | POA: Diagnosis not present

## 2016-02-07 NOTE — Telephone Encounter (Signed)
Last office visit 02/06/16 with Dr Birdie SonsSonnenberg ,hypertension Saw you in 10/17/15 Last written 12/04/15

## 2016-02-09 MED ORDER — TRAMADOL HCL 50 MG PO TABS: 50.0000 mg | ORAL_TABLET | Freq: Three times a day (TID) | ORAL | 0 refills | Status: DC | PRN

## 2016-02-09 NOTE — Telephone Encounter (Signed)
Refilled authorizd.

## 2016-02-12 ENCOUNTER — Other Ambulatory Visit: Payer: Self-pay | Admitting: Internal Medicine

## 2016-02-13 ENCOUNTER — Other Ambulatory Visit: Payer: BLUE CROSS/BLUE SHIELD

## 2016-02-17 ENCOUNTER — Other Ambulatory Visit (INDEPENDENT_AMBULATORY_CARE_PROVIDER_SITE_OTHER): Payer: BLUE CROSS/BLUE SHIELD

## 2016-02-17 DIAGNOSIS — I10 Essential (primary) hypertension: Secondary | ICD-10-CM | POA: Diagnosis not present

## 2016-02-17 LAB — BASIC METABOLIC PANEL
BUN: 16 mg/dL (ref 6–23)
CO2: 31 mEq/L (ref 19–32)
Calcium: 9.5 mg/dL (ref 8.4–10.5)
Chloride: 104 mEq/L (ref 96–112)
Creatinine, Ser: 0.94 mg/dL (ref 0.40–1.50)
GFR: 89.21 mL/min (ref >60.00)
Glucose, Bld: 98 mg/dL (ref 70–99)
Potassium: 4.2 mEq/L (ref 3.5–5.1)
Sodium: 140 mEq/L (ref 135–145)

## 2016-02-19 ENCOUNTER — Encounter: Payer: Self-pay | Admitting: Internal Medicine

## 2016-02-19 ENCOUNTER — Ambulatory Visit (INDEPENDENT_AMBULATORY_CARE_PROVIDER_SITE_OTHER): Payer: BLUE CROSS/BLUE SHIELD | Admitting: Internal Medicine

## 2016-02-19 VITALS — BP 131/89 | HR 68 | Ht 73.5 in | Wt 214.0 lb

## 2016-02-19 DIAGNOSIS — I10 Essential (primary) hypertension: Secondary | ICD-10-CM

## 2016-02-19 DIAGNOSIS — Z8249 Family history of ischemic heart disease and other diseases of the circulatory system: Secondary | ICD-10-CM | POA: Diagnosis not present

## 2016-02-19 MED ORDER — CARVEDILOL 3.125 MG PO TABS
3.1250 mg | ORAL_TABLET | Freq: Two times a day (BID) | ORAL | 6 refills | Status: DC
Start: 2016-02-19 — End: 2016-06-01

## 2016-02-19 NOTE — Patient Instructions (Addendum)
Medication Instructions:  Your physician has recommended you make the following change in your medication:  1- START Carvedilol 3.125 mg (1 tablet) by mouth two times a day.   Labwork: none  Testing/Procedures: Your physician has requested that you have an echocardiogram. Echocardiography is a painless test that uses sound waves to create images of your heart. It provides your doctor with information about the size and shape of your heart and how well your heart's chambers and valves are working. This procedure takes approximately one hour. There are no restrictions for this procedure.  Your physician has order a CT Calcium Scoring. This procedure uses special x-ray equipment to produce pictures of the coronary arteries to determine if they are blocked or narrowed by the buildup of calcium - an indicator for atherosclerosis or coronary artery disease (CAD).   Follow-Up: Your physician recommends that you schedule a follow-up appointment in: 6 WEEKS WITH DR END.  if you need a refill on your cardiac medications before your next appointment, please call your pharmacy.   Coronary Calcium Scan A coronary calcium scan is an imaging test used to look for deposits of calcium and other fatty materials (plaques) in the inner lining of the blood vessels of your heart (coronary arteries). These deposits of calcium and plaques can partly clog and narrow the coronary arteries without producing any symptoms or warning signs. This puts you at risk for a heart attack. This test can detect these deposits before symptoms develop.  LET [facility] PROVIDER KNOW ABOUT:  Any allergies you have.  All medicines you are taking, including vitamins, herbs, eye drops, creams, and over-the-counter medicines.  Previous problems you or members of your family have had with the use of anesthetics.  Any blood disorders you have.  Previous surgeries you have had.  Medical conditions you have.  Possibility of  pregnancy, if this applies. RISKS AND COMPLICATIONS Generally, this is a safe procedure. However, as with any procedure, complications can occur. This test involves the use of radiation. Radiation exposure can be dangerous to a pregnant woman and her unborn baby. If you are pregnant, you should not have this procedure done.  BEFORE THE PROCEDURE There is no special preparation for the procedure. PROCEDURE  You will need to undress and put on a hospital gown. You will need to remove any jewelry around your neck or chest.  Sticky electrodes are placed on your chest and are connected to an electrocardiogram (EKG or electrocardiography) machine to recorda tracing of the electrical activity of your heart.  A CT scanner will take pictures of your heart. During this time, you will be asked to lie still and hold your breath for 2-3 seconds while a picture is being taken of your heart. AFTER THE PROCEDURE   You will be allowed to get dressed.  You can return to your normal activities after the scan is done. This information is not intended to replace advice given to you by your health care provider. Make sure you discuss any questions you have with your health care provider. Document Released: 09/12/2007 Document Revised: 03/21/2013 Document Reviewed: 11/21/2012 Elsevier Interactive Patient Education  2017 [employer]  Echocardiogram An echocardiogram, or echocardiography, uses sound waves (ultrasound) to produce an image of your heart. The echocardiogram is simple, painless, obtained within a short period of time, and offers valuable information to your health care provider. The images from an echocardiogram can provide information such as:  Evidence of coronary artery disease (CAD).  Heart  size.  Heart muscle function.  Heart valve function.  Aneurysm detection.  Evidence of a past heart attack.  Fluid buildup around the heart.  Heart muscle thickening.  Assess heart valve  function. Tell a health care provider about:  Any allergies you have.  All medicines you are taking, including vitamins, herbs, eye drops, creams, and over-the-counter medicines.  Any problems you or family members have had with anesthetic medicines.  Any blood disorders you have.  Any surgeries you have had.  Any medical conditions you have.  Whether you are pregnant or may be pregnant. What happens before the procedure? No special preparation is needed. Eat and drink normally. What happens during the procedure?  In order to produce an image of your heart, gel will be applied to your chest and a wand-like tool (transducer) will be moved over your chest. The gel will help transmit the sound waves from the transducer. The sound waves will harmlessly bounce off your heart to allow the heart images to be captured in real-time motion. These images will then be recorded.  You may need an IV to receive a medicine that improves the quality of the pictures. What happens after the procedure? You may return to your normal schedule including diet, activities, and medicines, unless your health care provider tells you otherwise. This information is not intended to replace advice given to you by your health care provider. Make sure you discuss any questions you have with your health care provider. Document Released: 03/13/2000 Document Revised: 11/02/2015 Document Reviewed: 11/21/2012 Elsevier Interactive Patient Education  2017 [employer]

## 2016-02-19 NOTE — Progress Notes (Signed)
New Outpatient Visit Date: 02/19/2016  Referring Provider: Sherlene Shamseresa L Tullo, MD [address] [place], [place] [id number]  Chief Complaint: Family history of cardiovascular disease  HPI:  Mr. Kevin Barry is a 55 y.o. year-old male with history of hypertension, who has been referred by Dr. Darrick Huntsmanullo for evaluation of family history of cardiovascular disease. The patient reports that he has been in good health with the exception of hypertension that has required escalation of his blood pressure therapy over the last few months. He notes that this began after he started taking meloxicam in July for back and shoulder pain. He found that his blood pressure was mildly elevated around 150/95 despite continuing to take lisinopril 5 mg daily, which he had been on for several years. Meloxicam was discontinued with some improvement in his blood pressure, though his diastolic readings remained mildly elevated. Two weeks ago, lisinopril was increased to 10 mg daily with some further improvement in his blood pressure. He notes that it continues to fluctuate between 118-130/80-90 mmHg.  Mr. Kevin Barry has otherwise felt well. He specifically denies chest pain, shortness of breath, palpitation, lightheadedness, orthopnea, and PND. He notices occasional cramping and minimal swelling of the left leg that he attributes to varicose veins that developed after several injuries to his left leg while playing soccer. He denies history of DVT. He remains quite active and does not have any chest pain or shortness of breath with exertion.  The patient is most concerned about his family history of cardiac disease. Specifically, his father died unexpectedly of a heart attack at age 53. He had not had any warning symptoms or prior history of cardiovascular disease. His mother also required emergent aortic valve replacement at age 53 (etiology of aortic valvular disease unknown, though she had received chest radiation for Hodgkin's disease  as a young woman). She has recovered well. Mr. Kevin Barry also reports that his daughter has Down syndrome but was not found to have any significant cardiac abnormalities on perinatal ultrasound.  --------------------------------------------------------------------------------------------------  Cardiovascular History & Procedures: Cardiovascular Problems:  Varicose veins  Family history of cardiovascular disease  Risk Factors:  Family history and male gender  Cath/PCI:  None  CV Surgery:  None  EP Procedures and Devices:  None  Non-Invasive Evaluation(s):  None  Recent CV Pertinent Labs: Lab Results  Component Value Date   CHOL 166 10/17/2015   HDL 44.40 10/17/2015   LDLCALC 105 (H) 10/17/2015   TRIG 86.0 10/17/2015   CHOLHDL 4 10/17/2015   K 4.2 02/17/2016   BUN 16 02/17/2016   CREATININE 0.94 02/17/2016   CREATININE 1.00 07/06/2014   --------------------------------------------------------------------------------------------------  Past Medical History:  Diagnosis Date  . Allergy   . Depression   . Disorder of keratinization    right eye.   Marland Kitchen. History of chicken pox   . Hypertension     Past Surgical History:  Procedure Laterality Date  . COLONOSCOPY WITH PROPOFOL N/A 12/05/2014   Procedure: COLONOSCOPY WITH PROPOFOL;  Surgeon: Earline MayotteJeffrey W Byrnett, MD;  [facility];  Service: Endoscopy;  Laterality: N/A;  . TONSILLECTOMY AND ADENOIDECTOMY      Outpatient Encounter Prescriptions as of 02/19/2016  Medication Sig  . acetaminophen (TYLENOL) 500 MG tablet Take 1,000 mg by mouth every 6 (six) hours as needed.  Marland Kitchen. albuterol (PROVENTIL HFA;VENTOLIN HFA) 108 (90 Base) MCG/ACT inhaler Inhale 2 puffs into the lungs every 6 (six) hours as needed for wheezing or shortness of breath.  Marland Kitchen. buPROPion [facility](WELLBUTRIN SR) 150  MG 12 hr tablet Take 150 mg by mouth 2 (two) times daily. Reported on 10/17/2015  . busPIRone (BUSPAR) 10 MG tablet Take 1 tablet by mouth 2 (two)  times daily.  Marland Kitchen lisinopril (PRINIVIL,ZESTRIL) 10 MG tablet Take 1 tablet (10 mg total) by mouth daily.  . traMADol (ULTRAM) 50 MG tablet Take 1 tablet (50 mg total) by mouth every 8 (eight) hours as needed.  . traZODone (DESYREL) 50 MG tablet Take 0.5-1 tablets (25-50 mg total) by mouth at bedtime as needed for sleep.  . carvedilol (COREG) 3.125 MG tablet Take 1 tablet (3.125 mg total) by mouth 2 (two) times daily with a meal.  . [DISCONTINUED] cephALEXin (KEFLEX) 500 MG capsule Take 1 capsule (500 mg total) by mouth 4 (four) times daily. (Patient not taking: Reported on 02/19/2016)  . [DISCONTINUED] meloxicam (MOBIC) 15 MG tablet Take 1 tablet (15 mg total) by mouth daily. (Patient not taking: Reported on 02/19/2016)  . [DISCONTINUED] predniSONE (DELTASONE) 20 MG tablet Take 2 tablets (40 mg total) by mouth daily with breakfast. (Patient not taking: Reported on 02/19/2016)   No facility-administered encounter medications on file as of 02/19/2016.    Allergies: Penicillins  Social History   Social History  . Marital status: Married    Spouse name: N/A  . Number of children: N/A  . Years of education: N/A   Occupational History  . Not on file.   Social History Main Topics  . Smoking status: Never Smoker  . Smokeless tobacco: Never Used  . Alcohol use Yes     Comment: maybe 1 glass of wine once a month  . Drug use: No  . Sexual activity: Not on file   Other Topics Concern  . Not on file   Social History Narrative  . No narrative on file    Family History  Problem Relation Age of Onset  . Cancer Mother     Hodgkin disease  . Heart disease Mother 79    aortic valve replacement  . Heart disease Father 21    MI  . Early death Father 40    acute MI  . Heart attack Father   . Crohn's disease Brother   . Heart disease Maternal Grandmother   . Stroke Maternal Grandmother   . Cancer Maternal Grandmother     unsure which cancer  . Cancer Maternal Grandfather     pancreatic  cancer?  . Heart disease Paternal Grandfather   . Stroke Paternal Grandfather   . Down syndrome Daughter     Review of Systems: A 12-system review of systems was performed and was negative except as noted in the HPI.  --------------------------------------------------------------------------------------------------  Physical Exam: BP 131/89 (BP Location: Left Arm, Patient Position: Sitting, Cuff Size: Large)   Pulse 68   Ht 6' 1.5" (1.867 m)   Wt 214 lb (97.1 kg)   BMI 27.85 kg/m   General:  Well-developed, well-nourished man seated comfortably in the exam room. HEENT: No conjunctival pallor or scleral icterus.  Moist mucous membranes.  OP clear. Neck: Supple without lymphadenopathy, thyromegaly, JVD, or HJR.  No carotid bruit. Lungs: Normal work of breathing.  Clear to auscultation bilaterally without wheezes or crackles. Heart: Regular rate and rhythm without murmurs, rubs, or gallops.  Non-displaced PMI. Abd: Bowel sounds present.  Soft, NT/ND without hepatosplenomegaly Ext: No lower extremity edema.  Radial, PT, and DP pulses are 2+ bilaterally . Multiple varicose veins noted along the left leg. Skin: warm and dry without rash Neuro:  CNIII-XII intact.  Strength and fine-touch sensation intact in upper and lower extremities bilaterally. Psych: Normal mood and affect.  EKG:  Normal sinus rhythm without significant abnormalities. No prior tracing available for comparison.  Lab Results  Component Value Date   WBC 5.9 10/17/2015   HGB 13.8 10/17/2015   HCT 41.4 10/17/2015   MCV 87.4 10/17/2015   PLT 272.0 10/17/2015    Lab Results  Component Value Date   NA 140 02/17/2016   K 4.2 02/17/2016   CL 104 02/17/2016   CO2 31 02/17/2016   BUN 16 02/17/2016   CREATININE 0.94 02/17/2016   GLUCOSE 98 02/17/2016   ALT 34 10/17/2015    Lab Results  Component Value Date   CHOL 166 10/17/2015   HDL 44.40 10/17/2015   LDLCALC 105 (H) 10/17/2015   TRIG 86.0 10/17/2015    CHOLHDL 4 10/17/2015   --------------------------------------------------------------------------------------------------  ASSESSMENT AND PLAN: Family history of cardiovascular disease The patient notes cardiovascular disease in both parents, with his father dying in his mid 5950s of a massive heart attack in his mother requiring urgent aortic valve surgery at age 53. The patient himself is asymptomatic with hypertension, male gender, and family history being his only major cardiovascular risk factors. Based on the [facility] ASCVD risk estimator, his 10-year ASCVD risk is 5.3% with a lifetime risk of 50%. His most recent LDL was 105, which does not warrant pharmacologic therapy at this time. We have agreed to perform a coronary calcium score CT to provide further risk stratification. Given his mother's history of unexplained aortic valve disease at age 53, we will also obtain an echocardiogram to ensure that he does not have any valvular heart disease such as a congenitally malformed aortic valve, which can be inherited.  Hypertension Blood pressure is borderline elevated today. Of note, lisinopril was increased a few weeks ago. We will not make any changes at this time.  Follow-up: Return to clinic in 6 weeks.  Yvonne Kendallhristopher Chrisanne Loose, MD 02/19/2016 3:50 PM

## 2016-03-24 ENCOUNTER — Other Ambulatory Visit: Payer: BLUE CROSS/BLUE SHIELD

## 2016-04-03 ENCOUNTER — Ambulatory Visit: Payer: BLUE CROSS/BLUE SHIELD | Admitting: Internal Medicine

## 2016-04-14 ENCOUNTER — Other Ambulatory Visit: Payer: BLUE CROSS/BLUE SHIELD

## 2016-04-19 ENCOUNTER — Encounter: Payer: Self-pay | Admitting: Internal Medicine

## 2016-04-21 ENCOUNTER — Ambulatory Visit: Payer: BLUE CROSS/BLUE SHIELD | Admitting: Internal Medicine

## 2016-04-21 ENCOUNTER — Other Ambulatory Visit: Payer: Self-pay | Admitting: Internal Medicine

## 2016-04-21 MED ORDER — CELECOXIB 200 MG PO CAPS
200.0000 mg | ORAL_CAPSULE | Freq: Every day | ORAL | 0 refills | Status: DC
Start: 2016-04-21 — End: 2016-05-09

## 2016-04-22 ENCOUNTER — Telehealth: Payer: Self-pay | Admitting: *Deleted

## 2016-04-22 NOTE — Telephone Encounter (Signed)
Patient never scheduled for CT Calcium Scoring as Dr End advised at office visit on 02/19/16. Left detail message, ok per DPR, and to call back and schedule as patient has appt with Dr End coming up on 05/12/16.

## 2016-04-30 ENCOUNTER — Other Ambulatory Visit: Payer: Self-pay | Admitting: Internal Medicine

## 2016-05-01 ENCOUNTER — Other Ambulatory Visit: Payer: BLUE CROSS/BLUE SHIELD

## 2016-05-01 NOTE — Telephone Encounter (Signed)
Refilled 02/09/16. Pt last seen 01/30/16. Please advise?

## 2016-05-02 ENCOUNTER — Telehealth: Payer: Self-pay | Admitting: Internal Medicine

## 2016-05-02 MED ORDER — TRAMADOL HCL 50 MG PO TABS: 50.0000 mg | ORAL_TABLET | Freq: Three times a day (TID) | ORAL | 0 refills | Status: DC | PRN

## 2016-05-02 NOTE — Telephone Encounter (Signed)
TRAMADOL REFILL AUTHORIZED, PLEASE PRINT AND/OR CALL IN  NEEWS 3 MONTH FOLLOW UP THIS MONTH

## 2016-05-04 ENCOUNTER — Other Ambulatory Visit: Payer: Self-pay

## 2016-05-04 MED ORDER — TRAMADOL HCL 50 MG PO TABS: 50.0000 mg | ORAL_TABLET | Freq: Three times a day (TID) | ORAL | 0 refills | Status: DC | PRN

## 2016-05-04 NOTE — Telephone Encounter (Signed)
Script printed for and appointment scheduled.

## 2016-05-04 NOTE — Telephone Encounter (Signed)
LMTC

## 2016-05-04 NOTE — Telephone Encounter (Signed)
Faxed

## 2016-05-07 ENCOUNTER — Encounter: Payer: Self-pay | Admitting: Internal Medicine

## 2016-05-08 ENCOUNTER — Other Ambulatory Visit: Payer: BLUE CROSS/BLUE SHIELD

## 2016-05-09 MED ORDER — CELECOXIB 200 MG PO CAPS: 200.0000 mg | ORAL_CAPSULE | Freq: Every day | ORAL | 5 refills | Status: DC

## 2016-05-12 ENCOUNTER — Ambulatory Visit: Payer: BLUE CROSS/BLUE SHIELD | Admitting: Internal Medicine

## 2016-05-13 ENCOUNTER — Ambulatory Visit: Payer: BLUE CROSS/BLUE SHIELD | Admitting: Internal Medicine

## 2016-05-15 ENCOUNTER — Ambulatory Visit (INDEPENDENT_AMBULATORY_CARE_PROVIDER_SITE_OTHER)
Admission: RE | Admit: 2016-05-15 | Discharge: 2016-05-15 | Disposition: A | Discharge status: Home / Self Care | Payer: Self-pay | Source: Ambulatory Visit | Attending: Internal Medicine | Admitting: Internal Medicine

## 2016-05-15 DIAGNOSIS — I10 Essential (primary) hypertension: Secondary | ICD-10-CM

## 2016-05-15 DIAGNOSIS — Z8249 Family history of ischemic heart disease and other diseases of the circulatory system: Secondary | ICD-10-CM

## 2016-05-20 ENCOUNTER — Ambulatory Visit (INDEPENDENT_AMBULATORY_CARE_PROVIDER_SITE_OTHER): Payer: BLUE CROSS/BLUE SHIELD

## 2016-05-20 ENCOUNTER — Other Ambulatory Visit: Payer: Self-pay

## 2016-05-20 DIAGNOSIS — Z8249 Family history of ischemic heart disease and other diseases of the circulatory system: Secondary | ICD-10-CM

## 2016-05-20 DIAGNOSIS — I10 Essential (primary) hypertension: Secondary | ICD-10-CM | POA: Diagnosis not present

## 2016-05-25 NOTE — Progress Notes (Signed)
Follow-up Outpatient Visit Date: 05/26/2016  Primary Care Provider: Crecencio Mc, MD [place] [address]  Chief Complaint: Follow-up family history of cardiovascular disease  HPI:  Kevin Barry is a 56 y.o. year-old male with history of hypertension, who presents for follow-up after testing for further cardiac risk stratification due to a family history of cardiovascular disease. We first met on 02/19/16, which time the patient was doing well but was concerned regarding his family history. We opted to perform a transthoracic echocardiogram, given his mom's history of aortic valve replacement and possible bicuspid aortic valve, as well as a coronary calcium score. Both of these studies were unrevealing with a coronary calcium score of 0 Agaston units. Kevin Barry has remained active, frequent playing soccer and running without any symptoms. He specifically denies chest pain and shortness of breath. He notes that his blood pressure has been upper normal (120-130/90 mmHg) at home. He attributes some of this to using celecoxib for arthritis. Kevin Barry notes that his mother was recently hospitalized in [place] for an MI with stent placement.  --------------------------------------------------------------------------------------------------  Cardiovascular History & Procedures: Cardiovascular Problems:  Varicose veins  Family history of cardiovascular disease  Risk Factors:  Hypertension, family history, and male gender  Cath/PCI:  None  CV Surgery:  None  EP Procedures and Devices:  None  Non-Invasive Evaluation(s):  Coronary calcium score CT (05/15/16): No significant coronary artery calcification (0 Agatston units). No significant intrathoracic abnormalities.  TTE (05/20/16): Normal LV size and wall thickness. LVEF 65-70% with normal diastolic function. Mild mitral valve thickening with trace MR. Normal RV size and function.  Recent CV Pertinent  Labs: Lab Results  Component Value Date   CHOL 166 10/17/2015   HDL 44.40 10/17/2015   LDLCALC 105 (H) 10/17/2015   TRIG 86.0 10/17/2015   CHOLHDL 4 10/17/2015   K 4.2 02/17/2016   BUN 16 02/17/2016   CREATININE 0.94 02/17/2016   CREATININE 1.00 07/06/2014    Past medical and surgical history were reviewed and updated in EPIC.  Outpatient Encounter Prescriptions as of 05/26/2016  Medication Sig  . acetaminophen (TYLENOL) 500 MG tablet Take 1,000 mg by mouth every 6 (six) hours as needed.  Marland Kitchen albuterol (PROVENTIL HFA;VENTOLIN HFA) 108 (90 Base) MCG/ACT inhaler Inhale 2 puffs into the lungs every 6 (six) hours as needed for wheezing or shortness of breath.  Marland Kitchen buPROPion (WELLBUTRIN SR) 150 MG 12 hr tablet Take 150 mg by mouth 2 (two) times daily. Reported on 10/17/2015  . busPIRone (BUSPAR) 10 MG tablet Take 1 tablet by mouth 2 (two) times daily.  . carvedilol (COREG) 3.125 MG tablet Take 1 tablet (3.125 mg total) by mouth 2 (two) times daily with a meal.  . celecoxib (CELEBREX) 200 MG capsule Take 1 capsule (200 mg total) by mouth daily.  Marland Kitchen lisinopril (PRINIVIL,ZESTRIL) 10 MG tablet Take 1 tablet (10 mg total) by mouth daily.  . traMADol (ULTRAM) 50 MG tablet Take 1 tablet (50 mg total) by mouth every 8 (eight) hours as needed.  . traZODone (DESYREL) 50 MG tablet Take 0.5-1 tablets (25-50 mg total) by mouth at bedtime as needed for sleep.   No facility-administered encounter medications on file as of 05/26/2016.     Allergies: Penicillins  Social History   Social History  . Marital status: Married    Spouse name: N/A  . Number of children: N/A  . Years of education: N/A   Occupational History  . Not on file.  Social History Main Topics  . Smoking status: Never Smoker  . Smokeless tobacco: Never Used  . Alcohol use Yes     Comment: maybe 1 glass of wine once a month  . Drug use: No  . Sexual activity: Not on file   Other Topics Concern  . Not on file   Social History  Narrative  . No narrative on file    Family History  Problem Relation Age of Onset  . Cancer Mother     Hodgkin disease  . Heart disease Mother 43    aortic valve replacement  . Heart attack Mother 56    has stent placed  . Heart disease Father 4    MI  . Early death Father 58    acute MI  . Heart attack Father   . Crohn's disease Brother   . Heart disease Maternal Grandmother   . Stroke Maternal Grandmother   . Cancer Maternal Grandmother     unsure which cancer  . Cancer Maternal Grandfather     pancreatic cancer?  . Heart disease Paternal Grandfather   . Stroke Paternal Grandfather   . Down syndrome Daughter     Review of Systems: Not performed.  --------------------------------------------------------------------------------------------------  Physical Exam: BP 132/90 (BP Location: Left Arm, Patient Position: Sitting, Cuff Size: Normal)   Pulse 68   Ht '6\' 2"'  (1.88 m)   Wt 217 lb 4 oz (98.5 kg)   BMI 27.89 kg/m   General:  Well-developed, well-nourished man seated comfortably in the exam room. HEENT: No conjunctival pallor or scleral icterus.  Moist mucous membranes.  OP clear. Neck: Supple without lymphadenopathy, thyromegaly, JVD, or HJR. Lungs: Normal work of breathing.  Clear to auscultation bilaterally without wheezes or crackles. Heart: Regular rate and rhythm without murmurs, rubs, or gallops.  Non-displaced PMI. Abd: Bowel sounds present.  Soft, NT/ND without hepatosplenomegaly Ext: No lower extremity edema.  Radial, PT, and DP pulses are 2+ bilaterally. Varicose veins noted in both calves. Skin: warm and dry without rash  Lab Results  Component Value Date   WBC 5.9 10/17/2015   HGB 13.8 10/17/2015   HCT 41.4 10/17/2015   MCV 87.4 10/17/2015   PLT 272.0 10/17/2015    Lab Results  Component Value Date   NA 140 02/17/2016   K 4.2 02/17/2016   CL 104 02/17/2016   CO2 31 02/17/2016   BUN 16 02/17/2016   CREATININE 0.94 02/17/2016   GLUCOSE  98 02/17/2016   ALT 34 10/17/2015    Lab Results  Component Value Date   CHOL 166 10/17/2015   HDL 44.40 10/17/2015   LDLCALC 105 (H) 10/17/2015   TRIG 86.0 10/17/2015   CHOLHDL 4 10/17/2015   --------------------------------------------------------------------------------------------------  ASSESSMENT AND PLAN: Family history of artery vascular disease Kevin Barry remains asymptomatic and very active. Coronary calcium score and echocardiogram were unrevealing. Given lack of exertional symptoms, I see no reason to proceed with a stress test. We discussed continued lifestyle modifications, including remaining active. LDL in 09/2015 was reasonable at 105; I do not see an indication for initiation of statin therapy at this time. I advised him to contact us if any symptoms develop.  Hypertension Blood pressure is borderline elevated today. We will continue Kevin Barry current medication regimen consisting of lisinopril and carvedilol. He should follow with Dr. Derrel Nip for further management of his hypertension.  Follow-up: Return to clinic as needed.  Nelva Bush, MD 05/26/2016 1:46 PM

## 2016-05-26 ENCOUNTER — Ambulatory Visit (INDEPENDENT_AMBULATORY_CARE_PROVIDER_SITE_OTHER): Payer: BLUE CROSS/BLUE SHIELD | Admitting: Internal Medicine

## 2016-05-26 ENCOUNTER — Encounter: Payer: Self-pay | Admitting: Internal Medicine

## 2016-05-26 VITALS — BP 132/90 | HR 68 | Ht 74.0 in | Wt 217.2 lb

## 2016-05-26 DIAGNOSIS — I10 Essential (primary) hypertension: Secondary | ICD-10-CM

## 2016-05-26 DIAGNOSIS — Z8249 Family history of ischemic heart disease and other diseases of the circulatory system: Secondary | ICD-10-CM | POA: Diagnosis not present

## 2016-05-26 NOTE — Patient Instructions (Signed)
Medication Instructions:  Your physician recommends that you continue on your current medications as directed. Please refer to the Current Medication list given to you today.   Labwork: none  Testing/Procedures: none  Follow-Up: Your physician recommends that you schedule a follow-up appointment ON AN AS NEEDED BASIS.  If you need a refill on your cardiac medications before your next appointment, please call your pharmacy.

## 2016-05-30 ENCOUNTER — Encounter: Payer: Self-pay | Admitting: Internal Medicine

## 2016-06-01 ENCOUNTER — Other Ambulatory Visit: Payer: Self-pay | Admitting: *Deleted

## 2016-06-01 MED ORDER — CARVEDILOL 3.125 MG PO TABS: 3.1250 mg | ORAL_TABLET | Freq: Two times a day (BID) | ORAL | 3 refills | Status: DC

## 2016-06-01 NOTE — Telephone Encounter (Signed)
Patient requests to have 90-day supply sent into his pharmacy as it is cheaper with his insurance.

## 2016-06-12 DIAGNOSIS — F411 Generalized anxiety disorder: Secondary | ICD-10-CM | POA: Diagnosis not present

## 2016-06-12 DIAGNOSIS — F330 Major depressive disorder, recurrent, mild: Secondary | ICD-10-CM | POA: Diagnosis not present

## 2016-06-29 ENCOUNTER — Encounter: Payer: Self-pay | Admitting: Internal Medicine

## 2016-07-01 MED ORDER — FLUTICASONE PROPIONATE 50 MCG/ACT NA SUSP: 2.0000 | Freq: Every day | NASAL | 3 refills | Status: DC

## 2016-07-07 DIAGNOSIS — L03221 Cellulitis of neck: Secondary | ICD-10-CM | POA: Diagnosis not present

## 2016-08-03 ENCOUNTER — Encounter: Payer: Self-pay | Admitting: Internal Medicine

## 2016-08-03 ENCOUNTER — Ambulatory Visit (INDEPENDENT_AMBULATORY_CARE_PROVIDER_SITE_OTHER): Payer: BLUE CROSS/BLUE SHIELD | Admitting: Internal Medicine

## 2016-08-03 VITALS — BP 140/86 | HR 75 | Temp 98.3°F | Resp 16 | Ht 74.0 in | Wt 218.0 lb

## 2016-08-03 DIAGNOSIS — M25511 Pain in right shoulder: Secondary | ICD-10-CM

## 2016-08-03 DIAGNOSIS — F411 Generalized anxiety disorder: Secondary | ICD-10-CM | POA: Diagnosis not present

## 2016-08-03 DIAGNOSIS — I10 Essential (primary) hypertension: Secondary | ICD-10-CM | POA: Diagnosis not present

## 2016-08-03 DIAGNOSIS — Z23 Encounter for immunization: Secondary | ICD-10-CM

## 2016-08-03 DIAGNOSIS — Z125 Encounter for screening for malignant neoplasm of prostate: Secondary | ICD-10-CM | POA: Diagnosis not present

## 2016-08-03 DIAGNOSIS — Z8249 Family history of ischemic heart disease and other diseases of the circulatory system: Secondary | ICD-10-CM | POA: Diagnosis not present

## 2016-08-03 DIAGNOSIS — M25512 Pain in left shoulder: Secondary | ICD-10-CM

## 2016-08-03 DIAGNOSIS — F324 Major depressive disorder, single episode, in partial remission: Secondary | ICD-10-CM

## 2016-08-03 MED ORDER — LISINOPRIL 10 MG PO TABS: 10.0000 mg | ORAL_TABLET | Freq: Every day | ORAL | 1 refills | Status: DC

## 2016-08-03 MED ORDER — TRAMADOL HCL 50 MG PO TABS: 50.0000 mg | ORAL_TABLET | Freq: Three times a day (TID) | ORAL | 0 refills | Status: DC | PRN

## 2016-08-03 MED ORDER — TRAMADOL HCL 50 MG PO TABS: 50.0000 mg | ORAL_TABLET | Freq: Three times a day (TID) | ORAL | 3 refills | Status: DC | PRN

## 2016-08-03 NOTE — Progress Notes (Signed)
Subjective:  Patient ID: Kevin Barry, male    DOB: [birth year]  Age: 56 y.o. MRN: [id number]  CC: The primary encounter diagnosis was Need for Tdap vaccination. Diagnoses of Essential hypertension, Prostate cancer screening, Family history of early CAD, Anxiety state, Major depressive disorder with single episode, in partial remission (HCC), and Pain of both shoulder joints were also pertinent to this visit.  HPI Kevin Barry presents for follow up on multiple conditions   Workup for asymptomatic heart disease was done in feb due to strong FH  Of CAD.  Cardiac CT was done by Dr. Okey Dupre along with ECHO all normal Feb 21 .    Has been checking  BP at home  130's /90,  using celebrex daily   Using wellbutrin sr only once daily . Trazodone prn not often.due to increaesd leg dysesthesias, almost like a restless legs feeling.  Using Buspirone 1-2 daily prn   Works 3rd shift,  Very physically demanding   Gets 32 ounces of water during shift.    Had a fall 6 weeks ago slipped on kdi's toy and landed on right iliac crest.  Still sore.    Lab Results  Component Value Date   PSA 1.14 10/17/2015   PSA 1.22 07/06/2014      Outpatient Medications Prior to Visit  Medication Sig Dispense Refill  . acetaminophen (TYLENOL) 500 MG tablet Take 1,000 mg by mouth every 6 (six) hours as needed.    Marland Kitchen albuterol (PROVENTIL HFA;VENTOLIN HFA) 108 (90 Base) MCG/ACT inhaler Inhale 2 puffs into the lungs every 6 (six) hours as needed for wheezing or shortness of breath. 3.7 g 0  . buPROPion (WELLBUTRIN SR) 150 MG 12 hr tablet Take 150 mg by mouth 2 (two) times daily. Reported on 10/17/2015    . busPIRone (BUSPAR) 10 MG tablet Take 1 tablet by mouth 2 (two) times daily.  1  . carvedilol (COREG) 3.125 MG tablet Take 1 tablet (3.125 mg total) by mouth 2 (two) times daily with a meal. 180 tablet 3  . celecoxib (CELEBREX) 200 MG capsule Take 1 capsule (200 mg total) by mouth daily. 30 capsule 5  . fluticasone (FLONASE) 50  MCG/ACT nasal spray Place 2 sprays into both nostrils daily. 16 g 3  . traZODone (DESYREL) 50 MG tablet Take 0.5-1 tablets (25-50 mg total) by mouth at bedtime as needed for sleep. 30 tablet 3  . lisinopril (PRINIVIL,ZESTRIL) 10 MG tablet Take 1 tablet (10 mg total) by mouth daily. 90 tablet 1  . traMADol (ULTRAM) 50 MG tablet Take 1 tablet (50 mg total) by mouth every 8 (eight) hours as needed. 120 tablet 0   No facility-administered medications prior to visit.     Review of Systems;  Patient denies headache, fevers, malaise, unintentional weight loss, skin rash, eye pain, sinus congestion and sinus pain, sore throat, dysphagia,  hemoptysis , cough, dyspnea, wheezing, chest pain, palpitations, orthopnea, edema, abdominal pain, nausea, melena, diarrhea, constipation, flank pain, dysuria, hematuria, urinary  Frequency, nocturia, numbness, tingling, seizures,  Focal weakness, Loss of consciousness,  Tremor, insomnia, depression, anxiety, and suicidal ideation.      Objective:  BP 140/86 (BP Location: Left Arm, Patient Position: Sitting, Cuff Size: Normal)   Pulse 75   Temp 98.3 F (36.8 C) (Oral)   Resp 16   Ht 6\' 2"  (1.88 m)   Wt 218 lb (98.9 kg)   SpO2 97%   BMI 27.99 kg/m   BP Readings from Last 3 Encounters:  08/03/16 140/86  05/26/16 132/90  02/19/16 131/89    Wt Readings from Last 3 Encounters:  08/03/16 218 lb (98.9 kg)  05/26/16 217 lb 4 oz (98.5 kg)  02/19/16 214 lb (97.1 kg)    General appearance: alert, cooperative and appears stated age Ears: normal TM's and external ear canals both ears Throat: lips, mucosa, and tongue normal; teeth and gums normal Neck: no adenopathy, no carotid bruit, supple, symmetrical, trachea midline and thyroid not enlarged, symmetric, no tenderness/mass/nodules Back: symmetric, no curvature. ROM normal. No CVA tenderness. Lungs: clear to auscultation bilaterally Heart: regular rate and rhythm, S1, S2 normal, no murmur, click, rub or  gallop Abdomen: soft, non-tender; bowel sounds normal; no masses,  no organomegaly Pulses: 2+ and symmetric Skin: Skin color, texture, turgor normal. No rashes or lesions Lymph nodes: Cervical, supraclavicular, and axillary nodes normal.  No results found for: HGBA1C  Lab Results  Component Value Date   CREATININE 0.94 02/17/2016   CREATININE 1.00 10/17/2015   CREATININE 1.00 07/06/2014    Lab Results  Component Value Date   WBC 5.9 10/17/2015   HGB 13.8 10/17/2015   HCT 41.4 10/17/2015   PLT 272.0 10/17/2015   GLUCOSE 98 02/17/2016   CHOL 166 10/17/2015   TRIG 86.0 10/17/2015   HDL 44.40 10/17/2015   LDLCALC 105 (H) 10/17/2015   ALT 34 10/17/2015   AST 33 10/17/2015   NA 140 02/17/2016   K 4.2 02/17/2016   CL 104 02/17/2016   CREATININE 0.94 02/17/2016   BUN 16 02/17/2016   CO2 31 02/17/2016   TSH 2.32 10/17/2015   PSA 1.14 10/17/2015    Ct Cardiac Scoring  Addendum Date: 05/15/2016   ADDENDUM REPORT: 05/15/2016 10:28 CLINICAL DATA:  Risk stratification EXAM: Coronary Calcium Score TECHNIQUE: The patient was scanned on a Siemens Somatom 64 slice scanner. Axial non-contrast 3 mm slices were carried out through the heart. The data set was analyzed on a dedicated work station and scored using the Agatson method. FINDINGS: Non-cardiac: See separate report from [facility]. Ascending Aorta:  Normal size, no calcifications. Pericardium: Normal. Coronary arteries:  Normal origin. IMPRESSION: Coronary calcium score of 0. This was 0 percentile for age and sex matched control. Tobias Alexander Electronically Signed   By: Tobias Alexander   On: 05/15/2016 10:28   Result Date: 05/15/2016 EXAM: OVER-READ INTERPRETATION  CT CHEST The following report is an over-read performed by radiologist Dr. Noe Gens [facility] on 05/15/2016. This over-read does not include interpretation of cardiac or coronary anatomy or pathology. The coronary calcium score interpretation  by the cardiologist is attached. COMPARISON:  None. FINDINGS: Cardiovascular: Heart is normal size. Visualized aorta normal caliber. Mediastinum/Nodes: No adenopathy in the lower mediastinum or hila. Lungs/Pleura: Visualized lungs are clear.  No effusions. Upper Abdomen: Imaging into the upper abdomen shows no acute findings. Musculoskeletal: Chest wall soft tissues and bony structures unremarkable. IMPRESSION: No acute or significant extracardiac abnormality. Electronically Signed: By: Charlett Nose M.D. On: 05/15/2016 09:09    Assessment & Plan:   Problem List Items Addressed This Visit    Anxiety state    Managed with buspirone       Essential hypertension    Not at goal with carvedilol and lisinopril . Advised to suspend celebrex for one week and reassess.   Lab Results  Component Value Date   CREATININE 0.94 02/17/2016   Lab Results  Component Value Date   NA 140 02/17/2016   K 4.2 02/17/2016  CL 104 02/17/2016   CO2 31 02/17/2016         Relevant Medications   lisinopril (PRINIVIL,ZESTRIL) 10 MG tablet   Other Relevant Orders   Lipid panel   Comprehensive metabolic panel   Family history of early CAD    Noninvasive workup for occult CAD was negative in Feb 2018 , including Coronary calcium scoring and ECHO      Major depressive disorder, single episode    Managed with wellbutrin       Pain in joint, shoulder region    Suspend celebrex due to elevated blood pressure.  turmeric,  Tramadol and tylenol adivsed .       Other Visit Diagnoses    Need for Tdap vaccination    -  Primary   Relevant Orders   Tdap vaccine greater than or equal to 7yo IM (Completed)   Prostate cancer screening       Relevant Orders   PSA   Microalbumin / creatinine urine ratio      I am having Mr. Shawnee KnappLevy maintain his buPROPion, albuterol, traZODone, busPIRone, acetaminophen, celecoxib, carvedilol, fluticasone, lisinopril, and traMADol.  Meds ordered this encounter  Medications  .  DISCONTD: traMADol (ULTRAM) 50 MG tablet    Sig: Take 1 tablet (50 mg total) by mouth every 8 (eight) hours as needed.    Dispense:  120 tablet    Refill:  0    Pt needs Appt for further refills  . lisinopril (PRINIVIL,ZESTRIL) 10 MG tablet    Sig: Take 1 tablet (10 mg total) by mouth daily.    Dispense:  90 tablet    Refill:  1  . traMADol (ULTRAM) 50 MG tablet    Sig: Take 1 tablet (50 mg total) by mouth every 8 (eight) hours as needed.    Dispense:  120 tablet    Refill:  3    Medications Discontinued During This Encounter  Medication Reason  . traMADol (ULTRAM) 50 MG tablet Reorder  . lisinopril (PRINIVIL,ZESTRIL) 10 MG tablet Reorder  . traMADol (ULTRAM) 50 MG tablet Reorder    Follow-up: No Follow-up on file.   Sherlene ShamsULLO, Cyndee Giammarco L, MD

## 2016-08-03 NOTE — Patient Instructions (Addendum)
You received the TDaP vaccine today for 10 yr booster vaccine.  I agree that your BP may  Be elevated due to daly NSAID use   Suspend the celebrex for one week and use tylenol /tramadol  To see if it normalizes (goal < 130/80)  Without  NSAIDS    You can also try turmeric 1500 mg once or twice daily in the interim   Return in August for annual CPE.  Fasting labs can be done DAYS PRIOR TO VISIT

## 2016-08-03 NOTE — Progress Notes (Signed)
Pre visit review using our clinic review tool, if applicable. No additional management support is needed unless otherwise documented below in the visit note. 

## 2016-08-05 NOTE — Assessment & Plan Note (Signed)
Managed with wellbutrin 

## 2016-08-05 NOTE — Assessment & Plan Note (Signed)
Suspend celebrex due to elevated blood pressure.  turmeric,  Tramadol and tylenol adivsed .

## 2016-08-05 NOTE — Assessment & Plan Note (Signed)
Managed with buspirone

## 2016-08-05 NOTE — Assessment & Plan Note (Addendum)
Not at goal with carvedilol and lisinopril . Advised to suspend celebrex for one week and reassess.   Lab Results  Component Value Date   CREATININE 0.94 02/17/2016   Lab Results  Component Value Date   NA 140 02/17/2016   K 4.2 02/17/2016   CL 104 02/17/2016   CO2 31 02/17/2016

## 2016-08-05 NOTE — Assessment & Plan Note (Signed)
Noninvasive workup for occult CAD was negative in Feb 2018 , including Coronary calcium scoring and ECHO

## 2016-08-12 ENCOUNTER — Encounter: Payer: Self-pay | Admitting: Internal Medicine

## 2016-08-12 DIAGNOSIS — I10 Essential (primary) hypertension: Secondary | ICD-10-CM

## 2016-08-13 NOTE — Assessment & Plan Note (Signed)
Improved with suspension of celebrex.  Readings at home < 120/70.

## 2016-10-07 DIAGNOSIS — F411 Generalized anxiety disorder: Secondary | ICD-10-CM | POA: Diagnosis not present

## 2016-10-07 DIAGNOSIS — F330 Major depressive disorder, recurrent, mild: Secondary | ICD-10-CM | POA: Diagnosis not present

## 2016-11-19 ENCOUNTER — Ambulatory Visit (INDEPENDENT_AMBULATORY_CARE_PROVIDER_SITE_OTHER): Payer: BLUE CROSS/BLUE SHIELD | Admitting: Family Medicine

## 2016-11-19 ENCOUNTER — Encounter: Payer: Self-pay | Admitting: Family Medicine

## 2016-11-19 DIAGNOSIS — M19042 Primary osteoarthritis, left hand: Secondary | ICD-10-CM | POA: Diagnosis not present

## 2016-11-19 DIAGNOSIS — Z23 Encounter for immunization: Secondary | ICD-10-CM

## 2016-11-19 DIAGNOSIS — M19041 Primary osteoarthritis, right hand: Secondary | ICD-10-CM | POA: Diagnosis not present

## 2016-11-19 NOTE — Progress Notes (Addendum)
Subjective:  Patient ID: Kevin Barry, male    DOB: April 18, 1960  Age: 56 y.o. MRN: [id number]  CC: Hand pain, decreased ROM  HPI:  56 year old male presents with the above complaints.  Hand pain, decreased range of motion  Patient reports that he has been required to do more tasks at work.  The workload has increased tremendously.  He states that over the past week he's had worsening hand pain and swelling.  Yesterday it became severe. He reports difficulty flexing his fingers. Decreased handgrip strength.  Associated swelling and pain.  He is currently taking Celebrex daily.  He states that his boss told him that he needs a note for work. Boss advised him to go home and rest.  Patient attributes his issues due to the amount of work he is doing as well as the time spent doing this. 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  He does have some improvement with Celebrex.  Social Hx   Social History   Social History  . Marital status: Married    Spouse name: N/A  . Number of children: N/A  . Years of education: N/A   Social History Main Topics  . Smoking status: Never Smoker  . Smokeless tobacco: Never Used  . Alcohol use Yes     Comment: maybe 1 glass of wine once a month  . Drug use: No  . Sexual activity: Not Asked   Other Topics Concern  . None   Social History Narrative  . None    Review of Systems  Constitutional: Negative.   Musculoskeletal:       Hand pain/swelling.   Objective:  BP 110/68 (BP Location: Left Arm, Patient Position: Sitting, Cuff Size: Normal)   Pulse 68   Temp 98.4 F (36.9 C) (Oral)   Resp 16   Wt 217 lb 8 oz (98.7 kg)   SpO2 99%   BMI 27.93 kg/m   BP/Weight 11/19/2016 08/03/2016 05/26/2016  Systolic BP 110 140 132  Diastolic BP 68 86 90  Wt. (Lbs) 217.5 218 217.25  BMI 27.93 27.99 27.89   Physical Exam  Constitutional: He is oriented to person, place, and time. He appears well-developed. No distress.    Pulmonary/Chest: Effort normal. No respiratory distress.  Musculoskeletal:  Left hand - decreased range of motion of the fingers. Patient has a swan-neck deformity of the left fifth digit. Mild swelling of the hand diffusely. Hand grip strength markedly decreased.  Right hand - decreased range of motion of the fingers. Mild swelling. Decreased handgrip strength.  Neurological: He is alert and oriented to person, place, and time.  Psychiatric: He has a normal mood and affect.  Vitals reviewed.   Lab Results  Component Value Date   WBC 5.9 10/17/2015   HGB 13.8 10/17/2015   HCT 41.4 10/17/2015   PLT 272.0 10/17/2015   GLUCOSE 98 02/17/2016   CHOL 166 10/17/2015   TRIG 86.0 10/17/2015   HDL 44.40 10/17/2015   LDLCALC 105 (H) 10/17/2015   ALT 34 10/17/2015   AST 33 10/17/2015   NA 140 02/17/2016   K 4.2 02/17/2016   CL 104 02/17/2016   CREATININE 0.94 02/17/2016   BUN 16 02/17/2016   CO2 31 02/17/2016   TSH 2.32 10/17/2015   PSA 1.14 10/17/2015    Assessment & Plan:   Problem List Items Addressed This Visit    Osteoarthritis of both hands    New problem. Patient is experiencing decreased range  of motion, swelling, and pain of both hands. Worse as of late.  Advised rest. Continue celebrex. Work note given.        Other Visit Diagnoses    Need for immunization against influenza       Relevant Orders   Flu Vaccine QUAD 36+ mos IM (Completed)     Follow-up: PRN  Everlene Other DO [facility]

## 2016-11-19 NOTE — Assessment & Plan Note (Addendum)
New problem. Patient is experiencing decreased range of motion, swelling, and pain of both hands. Worse as of late.  Advised rest. Continue celebrex. Work note given.

## 2016-11-19 NOTE — Addendum Note (Signed)
Addended by: Tommie Sams on: 11/19/2016 10:50 AM   Modules accepted: Level of Service

## 2016-11-19 NOTE — Patient Instructions (Signed)
Have them send the paperwork if needed.   Take care  Dr. Adriana Simas

## 2016-12-04 ENCOUNTER — Other Ambulatory Visit: Payer: Self-pay | Admitting: Internal Medicine

## 2016-12-11 ENCOUNTER — Ambulatory Visit: Payer: BLUE CROSS/BLUE SHIELD | Admitting: Family Medicine

## 2016-12-15 ENCOUNTER — Ambulatory Visit (INDEPENDENT_AMBULATORY_CARE_PROVIDER_SITE_OTHER): Payer: BLUE CROSS/BLUE SHIELD | Admitting: Family Medicine

## 2016-12-15 ENCOUNTER — Encounter: Payer: Self-pay | Admitting: Family Medicine

## 2016-12-15 DIAGNOSIS — M25512 Pain in left shoulder: Secondary | ICD-10-CM | POA: Diagnosis not present

## 2016-12-15 MED ORDER — METHYLPREDNISOLONE ACETATE 80 MG/ML IJ SUSP
80.0000 mg | Freq: Once | INTRAMUSCULAR | Status: AC
  Administered 2016-12-15: 80 mg via INTRAMUSCULAR

## 2016-12-15 NOTE — Progress Notes (Signed)
Subjective:  Patient ID: Kevin Barry, male    DOB: 11/05/1960  Age: 56 y.o. MRN: [id number]  CC: Shoulder pain  HPI:  56 year old male presents with left shoulder pain.  Patient has a history of shoulder pain. He states that it has been worse as of late. He was recently seen at a local urgent care at the end of August. He reports he is having decreased range of motion and pain of the left shoulder. Decreased range of motion particularly in abduction but also in flexion. His pain is worse with range of motion/activity. He reports his pain is located laterally. He also reports pain in the trapezius/scapular region. He's taken his home medication without improvement. No other associated symptoms. No other complaints or concerns this time.  Social Hx   Social History   Social History  . Marital status: Married    Spouse name: N/A  . Number of children: N/A  . Years of education: N/A   Social History Main Topics  . Smoking status: Never Smoker  . Smokeless tobacco: Never Used  . Alcohol use Yes     Comment: maybe 1 glass of wine once a month  . Drug use: No  . Sexual activity: Not Asked   Other Topics Concern  . None   Social History Narrative  . None    Review of Systems  Constitutional: Negative.   Musculoskeletal:       Shoulder pain and decreased ROM.   Objective:  BP 118/84 (BP Location: Right Arm, Patient Position: Sitting, Cuff Size: Normal)   Pulse 78   Temp 98 F (36.7 C) (Oral)   Wt 226 lb 2 oz (102.6 kg)   SpO2 96%   BMI 29.03 kg/m   BP/Weight 12/15/2016 11/19/2016 08/03/2016  Systolic BP 118 110 140  Diastolic BP 84 68 86  Wt. (Lbs) 226.13 217.5 218  BMI 29.03 27.93 27.99   Physical Exam  Constitutional: He is oriented to person, place, and time. He appears well-developed. No distress.  Pulmonary/Chest: Effort normal. No respiratory distress.  Musculoskeletal:  Shoulder: Left Inspection reveals no abnormalities, atrophy or asymmetry. Palpation is  normal with no tenderness over AC joint or bicipital groove. ROM decreased in abduction and flexion. Rotator cuff strength: 4/5 Subscapularis. Remainder 5/5. + Hawkins. + Painful arc.   Neurological: He is alert and oriented to person, place, and time.  Psychiatric: He has a normal mood and affect.  Vitals reviewed.   Lab Results  Component Value Date   WBC 5.9 10/17/2015   HGB 13.8 10/17/2015   HCT 41.4 10/17/2015   PLT 272.0 10/17/2015   GLUCOSE 98 02/17/2016   CHOL 166 10/17/2015   TRIG 86.0 10/17/2015   HDL 44.40 10/17/2015   LDLCALC 105 (H) 10/17/2015   ALT 34 10/17/2015   AST 33 10/17/2015   NA 140 02/17/2016   K 4.2 02/17/2016   CL 104 02/17/2016   CREATININE 0.94 02/17/2016   BUN 16 02/17/2016   CO2 31 02/17/2016   TSH 2.32 10/17/2015   PSA 1.14 10/17/2015    Assessment & Plan:   Problem List Items Addressed This Visit    Left shoulder pain    Hx of shoulder pain.  Now with exacerbation. Injection given today. Sending to PT.  Procedure: Left subacromial bursa injection Consent signed and will be scanned into record. Medication:  80 mg of Depo-Medrol, and 4 mL's of lidocaine 1% without epinephrine Preparation: area cleansed with alcohol x 3 Injection  Landmarks  identified Above medication injected using a standard posterior approach. Patient tolerated well without bleeding or paresthesias        Relevant Orders   Ambulatory referral to Physical Therapy     Follow-up: PRN  Everlene Other DO [facility]

## 2016-12-15 NOTE — Addendum Note (Signed)
Addended by: Alisia Ferrari on: 12/15/2016 08:43 AM   Modules accepted: Orders

## 2016-12-15 NOTE — Assessment & Plan Note (Signed)
Hx of shoulder pain.  Now with exacerbation. Injection given today. Sending to PT.  Procedure: Left subacromial bursa injection Consent signed and will be scanned into record. Medication:  80 mg of Depo-Medrol, and 4 mL's of lidocaine 1% without epinephrine Preparation: area cleansed with alcohol x 3 Injection  Landmarks identified Above medication injected using a standard posterior approach. Patient tolerated well without bleeding or paresthesias

## 2016-12-15 NOTE — Patient Instructions (Signed)
We will call regarding PT.  Take care  Dr. Adriana Simas

## 2016-12-29 ENCOUNTER — Ambulatory Visit: Payer: BLUE CROSS/BLUE SHIELD | Attending: Family Medicine | Admitting: Physical Therapy

## 2016-12-29 DIAGNOSIS — M25512 Pain in left shoulder: Secondary | ICD-10-CM | POA: Diagnosis not present

## 2016-12-29 NOTE — Patient Instructions (Addendum)
AROM to 91 degrees of flexion 75 degrees abduction   L rotation feels some discomfort in his L shoulder  L side flexion same as with rotation  Flexion/extension WNL   Overpressure with flexion and extension felt some discomfort on shoulder blade, but the worst was L side flexion with overpressure.   Mild discomfort with R overpressure into thoracic rotation, not with L   ER PROM to 42 degrees IR PROM to 42 degrees   R Grip 82 75 90  L Grip 50 42 46  MMT  IR, ER, ABduction all 4+/5 but painful   Manual treatment and assessment with HEP  Very tender to grade I-II mobilizations un upper thoracic through distal cervical spine, even with soft tissue mobilizations   Isometric cervical flexion, abduction, lateral rotationas  Seated thoracic extensions   Passive ER stretching in supine with PVC

## 2016-12-29 NOTE — Therapy (Signed)
[facility] [address], [place], [address] Phone: [phone]   Fax:  [phone]  Physical Therapy Evaluation  Patient Details  Name: Kevin Barry MRN: [id number] Date of Birth: 1961-02-18 Referring Provider: Dr. Adriana Simas  Encounter Date: 12/29/2016      PT End of Session - 12/29/16 1117    Visit Number 1   Number of Visits 13   Date for PT Re-Evaluation 02/23/17   PT Start Time 1015   PT Stop Time 1112   PT Time Calculation (min) 57 min   Activity Tolerance Patient tolerated treatment well;Patient limited by pain   Behavior During Therapy [facility] for tasks assessed/performed      Past Medical History:  Diagnosis Date  . Allergy   . Depression   . Disorder of keratinization    right eye.   Marland Kitchen History of chicken pox   . Hypertension     Past Surgical History:  Procedure Laterality Date  . COLONOSCOPY WITH PROPOFOL N/A 12/05/2014   Procedure: COLONOSCOPY WITH PROPOFOL;  Surgeon: Earline Mayotte, MD;  Location: [facility] ENDOSCOPY;  Service: Endoscopy;  Laterality: N/A;  . TONSILLECTOMY AND ADENOIDECTOMY      There were no vitals filed for this visit.       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 He reports he got swelling and loss of motion in his forearm, which has dissipated. He has had some numbness in his 2nd and 3rd fingers. Denies any neck pain. The pain he experiences is in the posterior portion of the shoulder around the scapula. He was twisting to his L to move the part at an elevated position, the sharp pain was immediately present. His physician discussed DeQuarvains in his L forearm, but his ROM there around the wrist has resolved. He is also having pain reaching forward and getting his hand behind his back. Lying down and  sleeping have been "a nightmare", very difficult for him to find a position of comfort.    Limitations House hold activities   Diagnostic tests No X-rays or MRIs at this time. He has received an injection which has helped somewhat.    Patient Stated Goals To return to work with no pain.    Currently in Pain? No/denies      AROM to 91 degrees of flexion 75 degrees abduction   L rotation feels some discomfort in his L shoulder  L side flexion same as with rotation  Flexion/extension WNL   Overpressure with flexion and extension felt some discomfort on shoulder blade, but the worst was L side flexion with overpressure.   Biceps Load II test - positive for reproduction of symptoms  Mild discomfort with R overpressure into thoracic rotation, not with L   ER PROM to 42 degrees IR PROM to 42 degrees   R Grip 82 75 90  L Grip 50 42 46  MMT  IR, ER, ABduction all 4+/5 but painful   Manual treatment and assessment with HEP  Very tender to grade I-II mobilizations un upper thoracic through distal cervical spine, even with soft tissue mobilizations   Isometric cervical flexion, abduction, lateral rotations x 2 for 10" holds  Seated thoracic extensions x 2 for 10" -- provided in handout   Passive ER stretching in supine  with PVC -- provided education and allowed patient to perform with feedback for technique.        [facility] PT Assessment - 12/30/16 1157      Assessment   Medical Diagnosis L shoulder pain   Referring Provider Dr. Adriana Simas   Prior Therapy None     Precautions   Precautions None     Restrictions   Weight Bearing Restrictions No     Balance Screen   Has the patient fallen in the past 6 months No   Has the patient had a decrease in activity level because of a fear of falling?  No     Home Nurse, mental health Private residence   Living Arrangements Spouse/significant other;Children     Prior Function   Level of Independence Independent      Cognition   Overall Cognitive Status Within Functional Limits for tasks assessed     Sensation   Light Touch Appears Intact            Objective measurements completed on examination: See above findings.                  PT Education - 12/29/16 1246    Education provided Yes   Education Details Differential diagnosis, proposed treatment plan, and next step if therapy is not beneficial.    Person(s) Educated Patient   Methods Explanation;Demonstration;Handout   Comprehension Verbalized understanding;Returned demonstration             PT Long Term Goals - 12/29/16 1247      PT LONG TERM GOAL #1   Title Patient will report worst pain of no more than 2/10 to demonstrate improved tolerance for ADLs.    Time 8   Period Weeks   Status New   Target Date 02/23/17     PT LONG TERM GOAL #2   Title Patient will report QuickDash score of less than 40% disability to demonstrate improved tolerance for ADLs.    Baseline 54.5%   Time 8   Period Weeks   Status New   Target Date 02/23/17     PT LONG TERM GOAL #3   Title Patient will demonstrate at least 150 degrees of active shoulder flexion with no increase in pain to demonstrate improved tolerance for ADLs.    Time 8   Period Weeks   Status New   Target Date 02/23/17                Plan - 12/29/16 1251    Clinical Impression Statement Patient is a 56 y/o male with acute onset of L shoulder pain after a lifting injury. He initially had swelling and numbness/tingling in his L forearm, which has largely alleviated. He does have complaints of clicking in the joint and has a positive Biceps Load II test which would be consistent with a SLAP tear. However, he also has pain with cervical ROM testing, and reports significant pain with thoracic and cervical manual assessment. He would benefit from skilled PT services including a thoracic manipulation based on his presentation this date, which will require sign off  from referring physician.    Clinical Presentation Evolving   Clinical Decision Making High   Rehab Potential Good   PT Frequency 2x / week   PT Duration 6 weeks   PT Treatment/Interventions Aquatic Therapy;Cryotherapy;Electrical Stimulation;Ultrasound;Traction;Iontophoresis /ml Dexamethasone;Neuromuscular re-education;Dry needling;Manual techniques;Therapeutic exercise;Therapeutic activities;Passive range of motion  Manipulation of thoracic spine   PT Next Visit Plan Re-assess manual  techniques of cervical and thoracic spine    PT Home Exercise Plan Passive ER stretching, thoracic extensions, and isometric cervical motion.    Consulted and Agree with Plan of Care Patient      Patient will benefit from skilled therapeutic intervention in order to improve the following deficits and impairments:  Pain, Impaired UE functional use, Decreased range of motion, Decreased strength  Visit Diagnosis: Acute pain of left shoulder - Plan: PT plan of care cert/re-cert     Problem List Patient Active Problem List   Diagnosis Date Noted  . Left shoulder pain 12/15/2016  . Osteoarthritis of both hands 11/19/2016  . Family history of early CAD 01/30/2016  . Anxiety state 10/20/2015  . Pain in joint, shoulder region 10/20/2015  . Insomnia secondary to depression with anxiety 05/23/2015  . Encounter for screening colonoscopy 07/25/2014  . Essential hypertension 07/08/2014  . Screening for hyperlipidemia 07/08/2014  . Major depressive disorder, single episode 07/08/2014  . Keratoconus of both eyes 07/08/2014   Alva Garnet PT, DPT, CSCS     12/30/2016, 11:58 AM  [facility] PH[facility] SPORTS MEDICINE [address] [place], [address] Phone: [phone]   Fax:  [phone]  Name: Rubert Frediani MRN: [id number] Date of Birth: [birth year]

## 2016-12-31 ENCOUNTER — Ambulatory Visit: Payer: BLUE CROSS/BLUE SHIELD | Admitting: Physical Therapy

## 2017-01-05 ENCOUNTER — Ambulatory Visit: Payer: BLUE CROSS/BLUE SHIELD

## 2017-01-05 DIAGNOSIS — M25512 Pain in left shoulder: Secondary | ICD-10-CM | POA: Diagnosis not present

## 2017-01-05 NOTE — Therapy (Signed)
[facility] [address] [place], [address] Phone: [phone]   Fax:  [phone]  Physical Therapy Treatment  Patient Details  Name: Kevin Barry MRN: [id number] Date of Birth: [birth year] Referring Provider: Dr. Adriana Simas  Encounter Date: 01/05/2017      PT End of Session - 01/05/17 0900    Visit Number 2   Number of Visits 13   Date for PT Re-Evaluation 02/23/17   PT Start Time 0849   PT Stop Time 0930   PT Time Calculation (min) 41 min   Activity Tolerance Patient tolerated treatment well   Behavior During Therapy [facility] for tasks assessed/performed      Past Medical History:  Diagnosis Date  . Allergy   . Depression   . Disorder of keratinization    right eye.   Marland Kitchen History of chicken pox   . Hypertension     Past Surgical History:  Procedure Laterality Date  . COLONOSCOPY WITH PROPOFOL N/A 12/05/2014   Procedure: COLONOSCOPY WITH PROPOFOL;  Surgeon: Earline Mayotte, MD;  Location: [facility] ENDOSCOPY;  Service: Endoscopy;  Laterality: N/A;  . TONSILLECTOMY AND ADENOIDECTOMY      There were no vitals filed for this visit.      Subjective Assessment - 01/05/17 0849    Subjective Pt reports that he is doing mildly better today. He is seeing a small increase in his movement. He states no change in the numbness/tingling in his L hand/fingers and persistent decreased grip strength. He continues to have L shoulder pain with movement but denies pain at rest.   Limitations House hold activities   Diagnostic tests No X-rays or MRIs at this time. He has received an injection which has helped somewhat.    Patient Stated Goals To return to work with no pain.    Currently in Pain? No/denies  No pain at rest, increase in pain with movement        TREATMENT  Manual Re-screened cervical spine due to persistent L hand numbness and weakness; Spurling A and B negative; Cervical distraction negative; C2-C7  CPA/UPA negative for reproduction of pain; Positive reproduction of scapular pain with L UPA at T1, negative with CPA, pain with UPA never extends past the shoulder; T1 L UPA grade III, 30s/bout x 5 bouts; T1 CPA grade III, 30s/bout x 5 bouts; Repeated protraction and retraction x 10 both reproduce posterior scapular pain but no centralization or peripheralization and no pain that extends into shoulder or downt he arm; PROM L shoulder flexion, scaption, ER, and IR with gentle end range holds x 20s, multiple bouts in each direction; Passive ER stretching in supine with PVC 30s hold x 3;  Ther-ex Isometric cervical flexion, retraction, lateral rotation with manual resistance 5s hold x 5 each direction; Pt educated and instructed to add supine canes for flexion as well as repeated cervical retractions to HEP; Reinforced current HEP, and follow-up as scheduled.                            PT Education - 01/05/17 0900    Education provided Yes   Education Details Plan of care, schedule follow-up with MD regarding hand numbness   Person(s) Educated Patient   Methods Explanation;Demonstration   Comprehension Verbalized understanding;Returned demonstration             PT Long Term Goals - 12/29/16 1247      PT  LONG TERM GOAL #1   Title Patient will report worst pain of no more than 2/10 to demonstrate improved tolerance for ADLs.    Time 8   Period Weeks   Status New   Target Date 02/23/17     PT LONG TERM GOAL #2   Title Patient will report QuickDash score of less than 40% disability to demonstrate improved tolerance for ADLs.    Baseline 54.5%   Time 8   Period Weeks   Status New   Target Date 02/23/17     PT LONG TERM GOAL #3   Title Patient will demonstrate at least 150 degrees of active shoulder flexion with no increase in pain to demonstrate improved tolerance for ADLs.    Time 8   Period Weeks   Status New   Target Date 02/23/17                Plan - 01/05/17 0900    Clinical Impression Statement Pt reports persistent L hand numbness today which has not changed since his injury. He reports L forearm swelling and wrist pain following his injury which have since improved. Unable to reproduce any radicular symptoms with cervical spine screen. Pt does report some increase in scapular pain with L UPA at T1 but denies pain that extends past his shoulder. Pt very painful with passive L shoulder flexion and ER today. Attempted ULTT but pt unable to tolerate test positions due to shoulder pain. Pt encouraged to follow-up with MD regarding persistent L hand numbness and weakness. Pt encouraged to add repeated cervical retractions as well as canes for flexion to his HEP. Follow-up as scheduled.    Rehab Potential Good   PT Frequency 2x / week   PT Duration 6 weeks   PT Treatment/Interventions Aquatic Therapy;Cryotherapy;Electrical Stimulation;Ultrasound;Traction;Iontophoresis /ml Dexamethasone;Neuromuscular re-education;Dry needling;Manual techniques;Therapeutic exercise;Therapeutic activities;Passive range of motion  Manipulation of thoracic spine   PT Next Visit Plan Re-assess manual techniques of cervical and thoracic spine    PT Home Exercise Plan Passive ER stretching, thoracic extensions, and isometric cervical motion.    Consulted and Agree with Plan of Care Patient      Patient will benefit from skilled therapeutic intervention in order to improve the following deficits and impairments:  Pain, Impaired UE functional use, Decreased range of motion, Decreased strength  Visit Diagnosis: Acute pain of left shoulder     Problem List Patient Active Problem List   Diagnosis Date Noted  . Left shoulder pain 12/15/2016  . Osteoarthritis of both hands 11/19/2016  . Family history of early CAD 01/30/2016  . Anxiety state 10/20/2015  . Pain in joint, shoulder region 10/20/2015  . Insomnia secondary to depression with  anxiety 05/23/2015  . Encounter for screening colonoscopy 07/25/2014  . Essential hypertension 07/08/2014  . Screening for hyperlipidemia 07/08/2014  . Major depressive disorder, single episode 07/08/2014  . Keratoconus of both eyes 07/08/2014   Lynnea Maizes PT, DPT   Richy Spradley 01/05/2017, 9:54 AM  [facility] PHYSICAL AND SPORTS MEDICINE [address], [place], [address] Phone: [phone]   Fax:  [phone]  Name: Jarick Harkins MRN: [id number] Date of Birth: [birth year]

## 2017-01-07 ENCOUNTER — Ambulatory Visit: Payer: BLUE CROSS/BLUE SHIELD | Admitting: Physical Therapy

## 2017-01-19 ENCOUNTER — Ambulatory Visit: Payer: BLUE CROSS/BLUE SHIELD | Admitting: Physical Therapy

## 2017-01-19 ENCOUNTER — Telehealth: Payer: Self-pay | Admitting: Internal Medicine

## 2017-01-19 NOTE — Telephone Encounter (Signed)
PT called and stated that DOS 11/19/16 was billed incorrectly. Pt states that [employer] does not have him paying his copay for the visit and that he was billed for a outpatient visit but there is no line for the steroid injection and is being charged $138. Please advise, thank you!

## 2017-01-22 ENCOUNTER — Ambulatory Visit: Payer: BLUE CROSS/BLUE SHIELD | Admitting: Physical Therapy

## 2017-01-26 ENCOUNTER — Ambulatory Visit: Payer: BLUE CROSS/BLUE SHIELD | Admitting: Physical Therapy

## 2017-01-26 ENCOUNTER — Other Ambulatory Visit: Payer: Self-pay | Admitting: Internal Medicine

## 2017-01-26 DIAGNOSIS — M25512 Pain in left shoulder: Secondary | ICD-10-CM | POA: Diagnosis not present

## 2017-01-26 NOTE — Patient Instructions (Signed)
Abduction to 122 degrees   Flexion to 126 degrees   Still has clicking when turning   Cervical motions all WNL and no pain   Grip strength  L - 64#, 67#, 64#  ER PROM to 43 degrees initially -- performed stretching into ER   Trigger point ischemic hold on L infraspinatus   To 140 degrees of flexion after   Isometric ER with towel between elbow and rib cage

## 2017-01-27 NOTE — Therapy (Signed)
[facility] [address] [place], [address] Phone: [phone]   Fax:  [phone]  Physical Therapy Treatment  Patient Details  Name: Kevin Barry MRN: [id number] Date of Birth: 1960-08-02 Referring Provider: Dr. Adriana Simas  Encounter Date: 01/26/2017      PT End of Session - 01/27/17 0842    Visit Number 3   Number of Visits 13   Date for PT Re-Evaluation 02/23/17   PT Start Time 1450   PT Stop Time 1530   PT Time Calculation (min) 40 min   Activity Tolerance Patient tolerated treatment well   Behavior During Therapy [facility] for tasks assessed/performed      Past Medical History:  Diagnosis Date  . Allergy   . Depression   . Disorder of keratinization    right eye.   Marland Kitchen History of chicken pox   . Hypertension     Past Surgical History:  Procedure Laterality Date  . COLONOSCOPY WITH PROPOFOL N/A 12/05/2014   Procedure: COLONOSCOPY WITH PROPOFOL;  Surgeon: Earline Mayotte, MD;  Location: [facility] ENDOSCOPY;  Service: Endoscopy;  Laterality: N/A;  . TONSILLECTOMY AND ADENOIDECTOMY      There were no vitals filed for this visit.      Subjective Assessment - 01/26/17 1454    Subjective Patient reports his neck is feeling much better, he still has some very mild numbness/tingling in his fingers but this hasn't changed. His main complaint is putting on clothes, the combined motion of adduction/IR/extension. He does get some thobbing pain in the L shoulder area if he holds his hand up for any length of time while driving.    Limitations House hold activities   Diagnostic tests No X-rays or MRIs at this time. He has received an injection which has helped somewhat.    Patient Stated Goals To return to work with no pain.    Currently in Pain? No/denies      Abduction to 122 degrees   Flexion to 126 degrees   Still has clicking when turning arm in circles actively  Cervical motions all WNL and no pain, CPAs  of upper thoracic spine and cervical spine are non painful at all levels.   Grip strength  L - 64#, 67#, 64#  ER PROM to 43 degrees initially -- performed stretching into ER x 10 minutes with oscillations and provided   Trigger point ischemic hold on L infraspinatus x 2 minutes with decrease in symptoms after completion  To 140 degrees of flexion after   Isometric ER with towel between elbow and rib cage 2 sets x 8 repetitions for gentle activation - 5-10" holds for strengthening and pain control                            PT Education - 01/27/17 0841    Education provided Yes   Education Details Will focus on pain management and forward flexion ROM first, then work on combo adduction/IR/extension pattern.    Person(s) Educated Patient   Methods Explanation;Demonstration;Handout   Comprehension Verbalized understanding;Returned demonstration             PT Long Term Goals - 12/29/16 1247      PT LONG TERM GOAL #1   Title Patient will report worst pain of no more than 2/10 to demonstrate improved tolerance for ADLs.    Time 8   Period Weeks   Status New  Target Date 02/23/17     PT LONG TERM GOAL #2   Title Patient will report QuickDash score of less than 40% disability to demonstrate improved tolerance for ADLs.    Baseline 54.5%   Time 8   Period Weeks   Status New   Target Date 02/23/17     PT LONG TERM GOAL #3   Title Patient will demonstrate at least 150 degrees of active shoulder flexion with no increase in pain to demonstrate improved tolerance for ADLs.    Time 8   Period Weeks   Status New   Target Date 02/23/17               Plan - 01/27/17 0843    Clinical Impression Statement Patient reports significant improvement in his pain, no longer having neck pain. He does continue to have L hand numbness/tingling, which he reports has not changed since onset of injury. His AROM and function have improved significantly since  baseline. Pain today seems more isolated to posterior musculature (teres minor/infraspinatus), and has limited ER passively still. Will continue to monitor numbness complaints in light of overall clinical picture he's progressing well.    Clinical Presentation Stable   Clinical Decision Making Moderate   Rehab Potential Good   PT Frequency 2x / week   PT Duration 6 weeks   PT Treatment/Interventions Aquatic Therapy;Cryotherapy;Electrical Stimulation;Ultrasound;Traction;Iontophoresis 4mg /ml Dexamethasone;Neuromuscular re-education;Dry needling;Manual techniques;Therapeutic exercise;Therapeutic activities;Passive range of motion  Manipulation of thoracic spine   PT Next Visit Plan Re-assess manual techniques of cervical and thoracic spine    PT Home Exercise Plan Passive ER stretching, thoracic extensions, and isometric cervical motion.    Consulted and Agree with Plan of Care Patient      Patient will benefit from skilled therapeutic intervention in order to improve the following deficits and impairments:  Pain, Impaired UE functional use, Decreased range of motion, Decreased strength  Visit Diagnosis: Acute pain of left shoulder     Problem List Patient Active Problem List   Diagnosis Date Noted  . Left shoulder pain 12/15/2016  . Osteoarthritis of both hands 11/19/2016  . Family history of early CAD 01/30/2016  . Anxiety state 10/20/2015  . Pain in joint, shoulder region 10/20/2015  . Insomnia secondary to depression with anxiety 05/23/2015  . Encounter for screening colonoscopy 07/25/2014  . Essential hypertension 07/08/2014  . Screening for hyperlipidemia 07/08/2014  . Major depressive disorder, single episode 07/08/2014  . Keratoconus of both eyes 07/08/2014   Alva GarnetPatrick McNamara PT, DPT, CSCS    01/27/2017, 8:46 AM  [facility] [address] [place], [place], [address] Phone: [phone]   Fax:   [phone]  Name: Kevin Barry MRN: [id number] Date of Birth: 09/07/1960

## 2017-01-28 ENCOUNTER — Ambulatory Visit: Payer: BLUE CROSS/BLUE SHIELD | Attending: Family Medicine | Admitting: Physical Therapy

## 2017-02-02 ENCOUNTER — Ambulatory Visit: Payer: BLUE CROSS/BLUE SHIELD | Admitting: Physical Therapy

## 2017-02-03 DIAGNOSIS — F411 Generalized anxiety disorder: Secondary | ICD-10-CM | POA: Diagnosis not present

## 2017-02-03 DIAGNOSIS — F330 Major depressive disorder, recurrent, mild: Secondary | ICD-10-CM | POA: Diagnosis not present

## 2017-02-04 ENCOUNTER — Ambulatory Visit: Payer: BLUE CROSS/BLUE SHIELD | Admitting: Physical Therapy

## 2017-02-09 ENCOUNTER — Ambulatory Visit: Payer: BLUE CROSS/BLUE SHIELD | Admitting: Physical Therapy

## 2017-02-11 ENCOUNTER — Ambulatory Visit: Payer: BLUE CROSS/BLUE SHIELD | Admitting: Physical Therapy

## 2017-02-15 ENCOUNTER — Ambulatory Visit: Payer: BLUE CROSS/BLUE SHIELD | Admitting: Physical Therapy

## 2017-02-17 ENCOUNTER — Encounter: Payer: BLUE CROSS/BLUE SHIELD | Admitting: Physical Therapy

## 2017-02-22 ENCOUNTER — Encounter: Payer: BLUE CROSS/BLUE SHIELD | Admitting: Physical Therapy

## 2017-02-24 ENCOUNTER — Encounter: Payer: BLUE CROSS/BLUE SHIELD | Admitting: Physical Therapy

## 2017-05-04 DIAGNOSIS — F330 Major depressive disorder, recurrent, mild: Secondary | ICD-10-CM | POA: Diagnosis not present

## 2017-05-04 DIAGNOSIS — F411 Generalized anxiety disorder: Secondary | ICD-10-CM | POA: Diagnosis not present

## 2017-05-27 DIAGNOSIS — F411 Generalized anxiety disorder: Secondary | ICD-10-CM | POA: Diagnosis not present

## 2017-05-27 DIAGNOSIS — F330 Major depressive disorder, recurrent, mild: Secondary | ICD-10-CM | POA: Diagnosis not present

## 2017-06-02 ENCOUNTER — Ambulatory Visit (INDEPENDENT_AMBULATORY_CARE_PROVIDER_SITE_OTHER): Payer: BC Managed Care – PPO | Admitting: Internal Medicine

## 2017-06-02 ENCOUNTER — Encounter: Payer: Self-pay | Admitting: Internal Medicine

## 2017-06-02 DIAGNOSIS — M25512 Pain in left shoulder: Secondary | ICD-10-CM

## 2017-06-02 DIAGNOSIS — I10 Essential (primary) hypertension: Secondary | ICD-10-CM

## 2017-06-02 DIAGNOSIS — F324 Major depressive disorder, single episode, in partial remission: Secondary | ICD-10-CM | POA: Diagnosis not present

## 2017-06-02 DIAGNOSIS — G8929 Other chronic pain: Secondary | ICD-10-CM

## 2017-06-02 DIAGNOSIS — Z8249 Family history of ischemic heart disease and other diseases of the circulatory system: Secondary | ICD-10-CM

## 2017-06-02 DIAGNOSIS — F411 Generalized anxiety disorder: Secondary | ICD-10-CM

## 2017-06-02 DIAGNOSIS — Z125 Encounter for screening for malignant neoplasm of prostate: Secondary | ICD-10-CM

## 2017-06-02 LAB — MICROALBUMIN / CREATININE URINE RATIO
Creatinine,U: 116.5 mg/dL
Microalb Creat Ratio: 0.6 mg/g (ref 0.0–30.0)
Microalb, Ur: <0.7 mg/dL (ref 0.0–1.9)

## 2017-06-02 LAB — LIPID PANEL
Cholesterol: 176 mg/dL (ref 0–200)
HDL: 41.2 mg/dL (ref >39.00)
LDL Cholesterol: 118 mg/dL — ABNORMAL HIGH (ref 0–99)
NonHDL: 134.8
Total CHOL/HDL Ratio: 4
Triglycerides: 83 mg/dL (ref 0.0–149.0)
VLDL: 16.6 mg/dL (ref 0.0–40.0)

## 2017-06-02 LAB — PSA: PSA: 0.92 ng/mL (ref 0.10–4.00)

## 2017-06-02 NOTE — Patient Instructions (Signed)
Your blood pressure is fine!  No changes today  Medications for blood pressure have been  refilled for 90 day intervals.   Return in  6 months

## 2017-06-02 NOTE — Addendum Note (Signed)
Addended by: Penne LashWIGGINS, Darleen Moffitt N on: 06/02/2017 11:02 AM   Modules accepted: Orders

## 2017-06-02 NOTE — Progress Notes (Signed)
Patient ID: Kevin Barry, male    DOB: 07/29/1960  Age: 57 y.o. MRN: [id number]  The patient is here for annual preventive  examination and management of other chronic and acute problems.   The risk factors are reflected in the social history.  The roster of all physicians providing medical care to patient - is listed in the Snapshot section of the chart.  Activities of daily living:  The patient is 100% independent in all ADLs: dressing, toileting, feeding as well as independent mobility  Home safety : The patient has smoke detectors in the home. They wear seatbelts.  There are no firearms at home. There is no violence in the home.   There is no risks for hepatitis, STDs or HIV. There is no   history of blood transfusion. They have no travel history to infectious disease endemic areas of the world.  The patient has seen their dentist in the last six month. They have seen their eye doctor in the last year. They admit to slight hearing difficulty with regard to whispered voices and some television programs.  They have deferred audiologic testing in the last year.  They do not  have excessive sun exposure. Discussed the need for sun protection: hats, long sleeves and use of sunscreen if there is significant sun exposure.   Diet: the importance of a healthy diet is discussed. They do have a healthy diet.  The benefits of regular aerobic exercise were discussed. She walks 4 times per week ,  20 minutes.   Depression screen: there are no signs or vegative symptoms of depression- irritability, change in appetite, anhedonia, sadness/tearfullness.  Cognitive assessment: the patient manages all their financial and personal affairs and is actively engaged. They could relate day,date,year and events; recalled 2/3 objects at 3 minutes; performed clock-face test normally.  The following portions of the patient's history were reviewed and updated as appropriate: allergies, current medications, past family  history, past medical history,  past surgical history, past social history  and problem list.  Visual acuity was not assessed per patient preference since she has regular follow up with her ophthalmologist. Hearing and body mass index were assessed and reviewed.   During the course of the visit the patient was educated and counseled about appropriate screening and preventive services including : fall prevention , diabetes screening, nutrition counseling, colorectal cancer screening, and recommended immunizations.     CC: Diagnoses of Prostate cancer screening, Essential hypertension, Family history of early CAD, Chronic left shoulder pain, Anxiety state, and Major depressive disorder with single episode, in partial remission (HCC) were pertinent to this visit.  FOLLOW UP ON HYPERTENSION managed with carvedilol and lisinopril .  He feels generally well. Patient is taking his medications as prescribed and notes no adverse effects.  Home BP readings have been done about once per week and are  generally < 130/80 .  he is avoiding added salt in his diet and exercising regularly about 3 times per week .  Finished Seeing PT for left shoulder pain in October . Improved.  No weight lifting,  No golfing   Seeing Kapur for depression/anxiety  last visiti feb 5 .  buspar was stopped and celexa added for anxiety. . Did not tolerate celexa,  Now on trintellix  With  No side effects  (sexual )   History Kevin Barry has a past medical history of Allergy, Depression, Disorder of keratinization, History of chicken pox, and Hypertension.   He has a past surgical history that  includes Tonsillectomy and adenoidectomy and Colonoscopy with propofol (N/A, 12/05/2014).   His family history includes Cancer in his maternal grandfather, maternal grandmother, and mother; Crohn's disease in his brother; Down syndrome in his daughter; Early death (age of onset: 9) in his father; Heart attack in his father; Heart attack (age of  onset: 59) in his mother; Heart disease in his maternal grandmother and paternal grandfather; Heart disease (age of onset: 87) in his father; Heart disease (age of onset: 71) in his mother; Stroke in his maternal grandmother and paternal grandfather.He reports that  has never smoked. he has never used smokeless tobacco. He reports that he drinks alcohol. He reports that he does not use drugs.  Outpatient Medications Prior to Visit  Medication Sig Dispense Refill  . albuterol (PROVENTIL HFA;VENTOLIN HFA) 108 (90 Base) MCG/ACT inhaler Inhale 2 puffs into the lungs every 6 (six) hours as needed for wheezing or shortness of breath. 3.7 g 0  . buPROPion (WELLBUTRIN SR) 150 MG 12 hr tablet Take 150 mg by mouth 2 (two) times daily. Reported on 10/17/2015    . carvedilol (COREG) 3.125 MG tablet Take 1 tablet (3.125 mg total) by mouth 2 (two) times daily with a meal. 180 tablet 3  . fluticasone (FLONASE) 50 MCG/ACT nasal spray Place 2 sprays into both nostrils daily. 16 g 3  . vortioxetine HBr (TRINTELLIX) 10 MG TABS tablet Take 10 mg by mouth daily.    Marland Kitchen lisinopril (PRINIVIL,ZESTRIL) 10 MG tablet TAKE 1 TABLET BY MOUTH ONCE DAILY 90 tablet 1  . acetaminophen (TYLENOL) 500 MG tablet Take 1,000 mg by mouth every 6 (six) hours as needed.    . celecoxib (CELEBREX) 200 MG capsule TAKE 1 CAPSULE BY MOUTH ONCE DAILY (Patient not taking: Reported on 06/02/2017) 30 capsule 5  . traMADol (ULTRAM) 50 MG tablet Take 1 tablet (50 mg total) by mouth every 8 (eight) hours as needed. (Patient not taking: Reported on 06/02/2017) 120 tablet 3  . busPIRone (BUSPAR) 10 MG tablet Take 1 tablet by mouth 2 (two) times daily.  1   No facility-administered medications prior to visit.     Review of Systems  Objective:  BP 128/88 (BP Location: Left Arm, Patient Position: Sitting, Cuff Size: Normal)   Pulse 80   Temp 97.8 F (36.6 C) (Oral)   Resp 14   Ht 6\' 2"  (1.88 m)   Wt 221 lb 3.2 oz (100.3 kg)   SpO2 98%   BMI 28.40 kg/m    Physical Exam    Assessment & Plan:   Problem List Items Addressed This Visit    Anxiety state    Managed by Dr Maryruth Bun with recent changes in medications made including use of Trintellix and buspieone,  And wellbutrin for depression       Relevant Medications   vortioxetine HBr (TRINTELLIX) 10 MG TABS tablet   Encounter for preventive health examination    Annual comprehensive preventive exam was done as well as an evaluation and management of acute and chronic conditions .  During the course of the visit the patient was educated and counseled about appropriate screening and preventive services including :  diabetes screening, lipid analysis with projected  10 year  risk for CAD , nutrition counseling, prostate and colorectal cancer screening, and recommended immunizations.  Printed recommendations for health maintenance screenings was given.   Annual PSAs have been normal.  Lab Results  Component Value Date   PSA 0.92 06/02/2017   PSA 1.14 10/17/2015  PSA 1.22 07/06/2014         Essential hypertension    Well controlled on current regimen. Renal function stable, no changes today.  Lab Results  Component Value Date   CREATININE 0.94 02/17/2016   Lab Results  Component Value Date   MICROALBUR <0.7 06/02/2017   Lab Results  Component Value Date   NA 140 02/17/2016   K 4.2 02/17/2016   CL 104 02/17/2016   CO2 31 02/17/2016         Family history of early CAD    Noninvasive workup for occult CAD was negative in Feb 2018 , including Coronary calcium scoring and ECHO      Left shoulder pain    Responded to steroid injection posteriori approach followed by PT.  Probable rotator cuff pathology.        Major depressive disorder, single episode    Managed with wellbutrin by Dr. Maryruth Bun.       Relevant Medications   vortioxetine HBr (TRINTELLIX) 10 MG TABS tablet      I have discontinued Kevin Barry's busPIRone. I am also having him maintain his buPROPion,  albuterol, acetaminophen, carvedilol, fluticasone, traMADol, celecoxib, and vortioxetine HBr.  No orders of the defined types were placed in this encounter.   Medications Discontinued During This Encounter  Medication Reason  . busPIRone (BUSPAR) 10 MG tablet Change in therapy    Follow-up: Return in about 6 months (around 12/03/2017) for hypertension .   Sherlene Shams, MD

## 2017-06-03 NOTE — Assessment & Plan Note (Signed)
Well controlled on current regimen. Renal function stable, no changes today.  Lab Results  Component Value Date   CREATININE 0.94 02/17/2016   Lab Results  Component Value Date   MICROALBUR <0.7 06/02/2017   Lab Results  Component Value Date   NA 140 02/17/2016   K 4.2 02/17/2016   CL 104 02/17/2016   CO2 31 02/17/2016

## 2017-06-04 ENCOUNTER — Telehealth: Payer: Self-pay | Admitting: Radiology

## 2017-06-04 ENCOUNTER — Encounter: Payer: Self-pay | Admitting: Internal Medicine

## 2017-06-04 ENCOUNTER — Other Ambulatory Visit: Payer: Self-pay | Admitting: Internal Medicine

## 2017-06-04 MED ORDER — LISINOPRIL 10 MG PO TABS: 10.0000 mg | ORAL_TABLET | Freq: Every day | ORAL | 1 refills | Status: DC

## 2017-06-04 NOTE — Addendum Note (Signed)
Addended by: Wilford CornerOVINGTON, Conan Mcmanaway W on: 06/04/2017 01:38 PM   Modules accepted: Orders

## 2017-06-04 NOTE — Telephone Encounter (Signed)
Carvedilol refill (Last filled by another provider) Last OV: 06/02/17 PCP: Darrick Huntsmanullo Pharmacy: [facility] [place] - [id number] AT Portal to [facility] Sites [phone] (Phone) [phone] (Fax)

## 2017-06-04 NOTE — Telephone Encounter (Addendum)
Can you Please ask patient to return for CMET

## 2017-06-04 NOTE — Telephone Encounter (Signed)
[facility] called and stated they are unable to add on CMET due to blood being to old.

## 2017-06-04 NOTE — Telephone Encounter (Signed)
Copied from CRM [phone]. Topic: Quick Communication - Rx Refill/Question >> Jun 04, 2017 12:42 PM Maia PettiesOrtiz, Kristie S wrote: Medication: carvedilol #180 and lisinopril #90 - pt states RXs were to be sent following OV 06/02/17 - requesting to send in 3 month supply  Has the patient contacted their pharmacy? Yes.  They have not received RX Preferred Pharmacy (with phone number or street name): [facility] [place], [place] - [id number] AT Portal to [employer] [phone] (Phone) [phone] (Fax)

## 2017-06-05 NOTE — Assessment & Plan Note (Signed)
Managed with wellbutrin by Dr. Maryruth BunKapur.

## 2017-06-05 NOTE — Assessment & Plan Note (Signed)
Responded to steroid injection posteriori approach followed by PT.  Probable rotator cuff pathology.

## 2017-06-05 NOTE — Assessment & Plan Note (Signed)
Noninvasive workup for occult CAD was negative in Feb 2018 , including Coronary calcium scoring and ECHO

## 2017-06-05 NOTE — Assessment & Plan Note (Signed)
Managed by Dr Maryruth BunKapur with recent changes in medications made including use of Trintellix and buspieone,  And wellbutrin for depression

## 2017-06-05 NOTE — Assessment & Plan Note (Addendum)
Annual comprehensive preventive exam was done as well as an evaluation and management of acute and chronic conditions .  During the course of the visit the patient was educated and counseled about appropriate screening and preventive services including :  diabetes screening, lipid analysis with projected  10 year  risk for CAD , nutrition counseling, prostate and colorectal cancer screening, and recommended immunizations.  Printed recommendations for health maintenance screenings was given.   Annual PSAs have been normal.  Lab Results  Component Value Date   PSA 0.92 06/02/2017   PSA 1.14 10/17/2015   PSA 1.22 07/06/2014

## 2017-06-07 MED ORDER — CARVEDILOL 3.125 MG PO TABS: 3.1250 mg | ORAL_TABLET | Freq: Two times a day (BID) | ORAL | 3 refills | Status: DC

## 2017-06-08 NOTE — Telephone Encounter (Signed)
LMTCB. Pt needs to be scheduled for a non fasting lab appt. Labs are ordered. PEC may speak with pt.

## 2017-06-24 NOTE — Telephone Encounter (Signed)
Spoke with pt and he has been scheduled for a lab appt tomorrow. Lab has been ordered.

## 2017-06-25 ENCOUNTER — Other Ambulatory Visit (INDEPENDENT_AMBULATORY_CARE_PROVIDER_SITE_OTHER): Payer: BC Managed Care – PPO

## 2017-06-25 DIAGNOSIS — I10 Essential (primary) hypertension: Secondary | ICD-10-CM | POA: Diagnosis not present

## 2017-06-25 LAB — COMPREHENSIVE METABOLIC PANEL
ALT: 35 U/L (ref 0–53)
AST: 23 U/L (ref 0–37)
Albumin: 4.1 g/dL (ref 3.5–5.2)
Alkaline Phosphatase: 41 U/L (ref 39–117)
BUN: 14 mg/dL (ref 6–23)
CO2: 32 mEq/L (ref 19–32)
Calcium: 9.2 mg/dL (ref 8.4–10.5)
Chloride: 101 mEq/L (ref 96–112)
Creatinine, Ser: 0.95 mg/dL (ref 0.40–1.50)
GFR: 87.68 mL/min (ref >60.00)
Glucose, Bld: 149 mg/dL — ABNORMAL HIGH (ref 70–99)
Potassium: 3.8 mEq/L (ref 3.5–5.1)
Sodium: 137 mEq/L (ref 135–145)
Total Bilirubin: 0.4 mg/dL (ref 0.2–1.2)
Total Protein: 7.7 g/dL (ref 6.0–8.3)

## 2017-06-27 ENCOUNTER — Encounter: Payer: Self-pay | Admitting: Internal Medicine

## 2017-06-27 ENCOUNTER — Other Ambulatory Visit: Payer: Self-pay | Admitting: Internal Medicine

## 2017-06-27 DIAGNOSIS — R7303 Prediabetes: Secondary | ICD-10-CM | POA: Insufficient documentation

## 2017-06-27 DIAGNOSIS — R7301 Impaired fasting glucose: Secondary | ICD-10-CM

## 2017-06-28 ENCOUNTER — Other Ambulatory Visit: Payer: Self-pay | Admitting: Internal Medicine

## 2017-06-28 DIAGNOSIS — E785 Hyperlipidemia, unspecified: Secondary | ICD-10-CM

## 2017-06-28 NOTE — Progress Notes (Signed)
pid

## 2017-07-05 DIAGNOSIS — F330 Major depressive disorder, recurrent, mild: Secondary | ICD-10-CM | POA: Diagnosis not present

## 2017-07-05 DIAGNOSIS — F411 Generalized anxiety disorder: Secondary | ICD-10-CM | POA: Diagnosis not present

## 2017-08-06 ENCOUNTER — Other Ambulatory Visit: Payer: Self-pay | Admitting: Internal Medicine

## 2017-08-06 MED ORDER — ALBUTEROL SULFATE HFA 108 (90 BASE) MCG/ACT IN AERS: 2.0000 | INHALATION_SPRAY | Freq: Four times a day (QID) | RESPIRATORY_TRACT | 2 refills | Status: DC | PRN

## 2017-09-13 ENCOUNTER — Ambulatory Visit: Payer: BC Managed Care – PPO | Admitting: Family

## 2017-09-13 ENCOUNTER — Encounter: Payer: Self-pay | Admitting: Family

## 2017-09-13 VITALS — BP 130/84 | HR 87 | Temp 98.3°F | Resp 16 | Wt 224.4 lb

## 2017-09-13 DIAGNOSIS — J40 Bronchitis, not specified as acute or chronic: Secondary | ICD-10-CM

## 2017-09-13 MED ORDER — PREDNISONE 10 MG PO TABS: ORAL_TABLET | ORAL | 0 refills | Status: DC

## 2017-09-13 NOTE — Patient Instructions (Signed)
Suspect viral or allergic etiology  Albuterol as needed  Dose pack as discussed - please start prior to 3pm if you start today as will keep you up at night  Let me know if not better

## 2017-09-13 NOTE — Progress Notes (Signed)
Subjective:    Patient ID: Kevin Barry, male    DOB: [birth year], 57 y.o.   MRN: [id number]  CC: Kevin Barry is a 57 y.o. male who presents today for an acute visit.    HPI: CC: Dry cough x 4 weeks, waxed and waned.   Irritating cough. Endorses wheezing. No cp, fever. Using flonase for allergies.   Over the years, had had similar symptoms- 'usually spring and summer. '  Has albuterol inhaler at home with temporary relief. No epigastric pain, belching, dyspepsia.  No coughing when sleeping or laying down.  No h/o asthma.   HTN- compliant with medications.       Never smoker  HISTORY:  Past Medical History:  Diagnosis Date  . Allergy   . Depression   . Disorder of keratinization    right eye.   Marland Kitchen History of chicken pox   . Hypertension    Past Surgical History:  Procedure Laterality Date  . COLONOSCOPY WITH PROPOFOL N/A 12/05/2014   Procedure: COLONOSCOPY WITH PROPOFOL;  Surgeon: Earline Mayotte, MD;  Location: [facility] ENDOSCOPY;  Service: Endoscopy;  Laterality: N/A;  . TONSILLECTOMY AND ADENOIDECTOMY     Family History  Problem Relation Age of Onset  . Cancer Mother        Hodgkin disease  . Heart disease Mother 68       aortic valve replacement  . Heart attack Mother 89       has stent placed  . Heart disease Father 19       MI  . Early death Father 57       acute MI  . Heart attack Father   . Crohn's disease Brother   . Heart disease Maternal Grandmother   . Stroke Maternal Grandmother   . Cancer Maternal Grandmother        unsure which cancer  . Cancer Maternal Grandfather        pancreatic cancer?  . Heart disease Paternal Grandfather   . Stroke Paternal Grandfather   . Down syndrome Daughter     Allergies: Penicillins Current Outpatient Medications on File Prior to Visit  Medication Sig Dispense Refill  . acetaminophen (TYLENOL) 500 MG tablet Take 1,000 mg by mouth every 6 (six) hours as needed.    Marland Kitchen albuterol (PROVENTIL HFA;VENTOLIN HFA) 108 (90  Base) MCG/ACT inhaler Inhale 2 puffs into the lungs every 6 (six) hours as needed for wheezing or shortness of breath. 18 g 2  . buPROPion (WELLBUTRIN SR) 150 MG 12 hr tablet Take 150 mg by mouth 2 (two) times daily. Reported on 10/17/2015    . carvedilol (COREG) 3.125 MG tablet Take 1 tablet (3.125 mg total) by mouth 2 (two) times daily with a meal. 180 tablet 3  . celecoxib (CELEBREX) 200 MG capsule TAKE 1 CAPSULE BY MOUTH ONCE DAILY 30 capsule 5  . fluticasone (FLONASE) 50 MCG/ACT nasal spray Place 2 sprays into both nostrils daily. 16 g 3  . lisinopril (PRINIVIL,ZESTRIL) 10 MG tablet Take 1 tablet (10 mg total) by mouth daily. 90 tablet 1  . traMADol (ULTRAM) 50 MG tablet Take 1 tablet (50 mg total) by mouth every 8 (eight) hours as needed. 120 tablet 3   No current facility-administered medications on file prior to visit.     Social History   Tobacco Use  . Smoking status: Never Smoker  . Smokeless tobacco: Never Used  Substance Use Topics  . Alcohol use: Yes    Comment: maybe 1  glass of wine once a month  . Drug use: No    Review of Systems  Constitutional: Negative for chills and fever.  HENT: Negative for congestion.   Respiratory: Positive for cough and wheezing. Negative for shortness of breath.   Cardiovascular: Negative for chest pain and palpitations.  Gastrointestinal: Negative for nausea and vomiting.      Objective:    BP 130/84 (BP Location: Left Arm, Patient Position: Sitting, Cuff Size: Normal)   Pulse 87   Temp 98.3 F (36.8 C) (Oral)   Resp 16   Wt 224 lb 6 oz (101.8 kg)   SpO2 97%   BMI 28.81 kg/m    Physical Exam  Constitutional: Vital signs are normal. He appears well-developed and well-nourished.  HENT:  Head: Normocephalic and atraumatic.  Right Ear: Hearing, tympanic membrane, external ear and ear canal normal. No drainage, swelling or tenderness. Tympanic membrane is not injected, not erythematous and not bulging. No middle ear effusion. No  decreased hearing is noted.  Left Ear: Hearing, tympanic membrane, external ear and ear canal normal. No drainage, swelling or tenderness. Tympanic membrane is not injected, not erythematous and not bulging.  No middle ear effusion. No decreased hearing is noted.  Nose: Nose normal. Right sinus exhibits no maxillary sinus tenderness and no frontal sinus tenderness. Left sinus exhibits no maxillary sinus tenderness and no frontal sinus tenderness.  Mouth/Throat: Uvula is midline, oropharynx is clear and moist and mucous membranes are normal. No oropharyngeal exudate, posterior oropharyngeal edema, posterior oropharyngeal erythema or tonsillar abscesses.  Eyes: Conjunctivae are normal.  Cardiovascular: Regular rhythm and normal heart sounds.  Pulmonary/Chest: Effort normal and breath sounds normal. No respiratory distress. He has no wheezes. He has no rhonchi. He has no rales.  Lymphadenopathy:       Head (right side): No submental, no submandibular, no tonsillar, no preauricular, no posterior auricular and no occipital adenopathy present.       Head (left side): No submental, no submandibular, no tonsillar, no preauricular, no posterior auricular and no occipital adenopathy present.    He has no cervical adenopathy.  Neurological: He is alert.  Skin: Skin is warm and dry.  Psychiatric: He has a normal mood and affect. His speech is normal and behavior is normal.  Vitals reviewed.      Assessment & Plan:  1. Bronchitis Patient is well-appearing.  No acute respiratory distress.  Similar episode such as this in the past.  Prednisone worked well in the past.  We will go ahead and start 6-day Dosepak.  Patient let me know if no improvement.  - predniSONE (DELTASONE) 10 MG tablet; Take 4 tablets ( total 40 mg) by mouth for 2 days; take 3 tablets ( total 30 mg) by mouth for 2 days; take 2 tablets ( total 20 mg) by mouth for 1 day; take 1 tablet ( total 10 mg) by mouth for 1 day.  Dispense: 17 tablet;  Refill: 0,e   I have discontinued Onalee HuaDavid Leet's vortioxetine HBr. I am also having him start on predniSONE. Additionally, I am having him maintain his buPROPion, acetaminophen, fluticasone, traMADol, celecoxib, lisinopril, carvedilol, and albuterol.   Meds ordered this encounter  Medications  . predniSONE (DELTASONE) 10 MG tablet    Sig: Take 4 tablets ( total 40 mg) by mouth for 2 days; take 3 tablets ( total 30 mg) by mouth for 2 days; take 2 tablets ( total 20 mg) by mouth for 1 day; take 1 tablet ( total  10 mg) by mouth for 1 day.    Dispense:  17 tablet    Refill:  0    Order Specific Question:   Supervising Provider    Answer:   Sherlene Shams [[id number]]    Return precautions given.   Risks, benefits, and alternatives of the medications and treatment plan prescribed today were discussed, and patient expressed understanding.   Education regarding symptom management and diagnosis given to patient on AVS.  Continue to follow with Sherlene Shams, MD for routine health maintenance.   Marjo Bicker and I agreed with plan.   Rennie Plowman, FNP

## 2017-09-20 ENCOUNTER — Telehealth: Payer: Self-pay | Admitting: Internal Medicine

## 2017-09-20 NOTE — Telephone Encounter (Signed)
Pt called to inform physician that he is going out of town @ 4[place] to [place] and he would liked to be contacted to inform the nurse of what pharmacy to send the medication too

## 2017-09-20 NOTE — Telephone Encounter (Signed)
Copied from CRM [id number]. Topic: Quick Communication - See Telephone Encounter >> Sep 20, 2017 11:10 AM Cipriano BunkerLambe, Annette S wrote: CRM for notification. See Telephone encounter for: 09/20/17.  predniSONE (DELTASONE) 10 MG tablet  He called saying still has symptoms and asking for another prescription. He is going out of town tomorrow and wants to knock it out.   [facility] [address] - [place], [place] - [address] [place] [id number] Phone: [phone] Fax: [phone]

## 2017-09-21 MED ORDER — PREDNISONE 10 MG PO TABS: ORAL_TABLET | ORAL | 0 refills | Status: DC

## 2017-09-21 NOTE — Telephone Encounter (Signed)
I did not see the patient,  Kevin Barry did. In the future please check to see who the patient saw to avoid delays in answering  Requests.  Prednisone taper request cannot be fulfilled without more information about what symptoms are still present.

## 2017-09-21 NOTE — Telephone Encounter (Signed)
6 day tapering dose of prednisone sent

## 2017-09-21 NOTE — Telephone Encounter (Signed)
Pt was seen on 09/13/2017 and was diagnosed with bronchitis. The pt called and stated that he has finished the prednisone taper but he is still having the symptoms. Attempted to call back to see what symptoms he is still having but pt did not answer the phone. The pt is going out of town this evening at 4pm and would like to know if he could get another round of prednisone.

## 2017-09-21 NOTE — Telephone Encounter (Signed)
Spoke with pt and he stated that when he saw Claris CheMargaret last Monday for wheezing, dry cough. Pt stated that he was prescribed prednisone and it helped while he was on it. Pt stated that as soon as he finished the prednisone the wheezing and dry cough came back. Pt is out of town until tomorrow evening.

## 2017-09-21 NOTE — Telephone Encounter (Signed)
Please advise 

## 2017-09-22 NOTE — Telephone Encounter (Signed)
LMTCB. Need to find out if the pt got his prednisone rx picked up from the pharmacy. PEC may speak with pt.

## 2017-09-23 NOTE — Telephone Encounter (Signed)
Spoke with pt and he stated that he did get the prednisone taper.

## 2017-10-11 ENCOUNTER — Encounter: Payer: Self-pay | Admitting: Internal Medicine

## 2017-10-11 ENCOUNTER — Ambulatory Visit (INDEPENDENT_AMBULATORY_CARE_PROVIDER_SITE_OTHER): Payer: BC Managed Care – PPO

## 2017-10-11 ENCOUNTER — Ambulatory Visit: Payer: BC Managed Care – PPO | Admitting: Internal Medicine

## 2017-10-11 VITALS — BP 128/88 | HR 79 | Temp 98.2°F | Resp 15 | Ht 74.0 in | Wt 225.4 lb

## 2017-10-11 DIAGNOSIS — R05 Cough: Secondary | ICD-10-CM | POA: Diagnosis not present

## 2017-10-11 DIAGNOSIS — Z8249 Family history of ischemic heart disease and other diseases of the circulatory system: Secondary | ICD-10-CM

## 2017-10-11 DIAGNOSIS — R059 Cough, unspecified: Secondary | ICD-10-CM

## 2017-10-11 DIAGNOSIS — R058 Other specified cough: Secondary | ICD-10-CM

## 2017-10-11 DIAGNOSIS — T464X5A Adverse effect of angiotensin-converting-enzyme inhibitors, initial encounter: Secondary | ICD-10-CM | POA: Diagnosis not present

## 2017-10-11 DIAGNOSIS — F5232 Male orgasmic disorder: Secondary | ICD-10-CM

## 2017-10-11 MED ORDER — LOSARTAN POTASSIUM 50 MG PO TABS: 50.0000 mg | ORAL_TABLET | Freq: Every day | ORAL | 3 refills | Status: DC

## 2017-10-11 MED ORDER — PREDNISONE 10 MG PO TABS: ORAL_TABLET | ORAL | 0 refills | Status: DC

## 2017-10-11 MED ORDER — BENZONATATE 200 MG PO CAPS: 200.0000 mg | ORAL_CAPSULE | Freq: Three times a day (TID) | ORAL | 1 refills | Status: DC | PRN

## 2017-10-11 MED ORDER — MONTELUKAST SODIUM 10 MG PO TABS: 10.0000 mg | ORAL_TABLET | Freq: Every day | ORAL | 3 refills | Status: DC

## 2017-10-11 NOTE — Progress Notes (Signed)
Subjective:  Patient ID: Kevin Barry, male    DOB: February 20, 1961  Age: 57 y.o. MRN: [id number]  CC: The primary encounter diagnosis was Cough. Diagnoses of Cough due to ACE inhibitor, Family history of early CAD, and Anorgasmia of male were also pertinent to this visit.  HPI Kevin Barry presents for persistent cough with wheezing.  Patient reports that the cough started abut 7 weeks ago after doing some heavy yard work.  It was not associated with fever,  Sinus drainage, or headache or body ahces.  He was treated with a  6 day tapering dose of  prednisone  On June 17 and cough improved but returned so the dose was repeated and the  Has persisted during daytime only.  . Using albuterol with transient relief lasting  2 hours.   No sneezing,  Congnestion,  Or PND.  It is   aggravated by exercise , not by eating.  improves with supine position.  Sustained a presumed spider bite on the anterior distal tibia several weeks ago,  self treated with mupirocin, finally resolving,    Saw Kapur last week for increased agitation    buproprion stopped and zoloft restarted .  Discussed anticipated recurrence of previously noted side effect of anorgasmia.   Outpatient Medications Prior to Visit  Medication Sig Dispense Refill  . acetaminophen (TYLENOL) 500 MG tablet Take 1,000 mg by mouth every 6 (six) hours as needed.    Marland Kitchen albuterol (PROVENTIL HFA;VENTOLIN HFA) 108 (90 Base) MCG/ACT inhaler Inhale 2 puffs into the lungs every 6 (six) hours as needed for wheezing or shortness of breath. 18 g 2  . carvedilol (COREG) 3.125 MG tablet Take 1 tablet (3.125 mg total) by mouth 2 (two) times daily with a meal. 180 tablet 3  . celecoxib (CELEBREX) 200 MG capsule TAKE 1 CAPSULE BY MOUTH ONCE DAILY 30 capsule 5  . fluticasone (FLONASE) 50 MCG/ACT nasal spray Place 2 sprays into both nostrils daily. 16 g 3  . sertraline (ZOLOFT) 50 MG tablet TAKE 1 TABLET BY MOUTH ONCE DAILY WITH BREAKFAST  0  . traMADol (ULTRAM) 50 MG tablet  Take 1 tablet (50 mg total) by mouth every 8 (eight) hours as needed. 120 tablet 3  . lisinopril (PRINIVIL,ZESTRIL) 10 MG tablet Take 1 tablet (10 mg total) by mouth daily. 90 tablet 1  . buPROPion (WELLBUTRIN SR) 150 MG 12 hr tablet Take 150 mg by mouth 2 (two) times daily. Reported on 10/17/2015    . predniSONE (DELTASONE) 10 MG tablet Take 4 tablets ( total 40 mg) by mouth for 2 days; take 3 tablets ( total 30 mg) by mouth for 2 days; take 2 tablets ( total 20 mg) by mouth for 1 day; take 1 tablet ( total 10 mg) by mouth for 1 day. (Patient not taking: Reported on 10/11/2017) 17 tablet 0  . predniSONE (DELTASONE) 10 MG tablet 6 tablets on Day 1 , then reduce by 1 tablet daily until gone (Patient not taking: Reported on 10/11/2017) 21 tablet 0   No facility-administered medications prior to visit.     Review of Systems;  Patient denies headache, fevers, malaise, unintentional weight loss, skin rash, eye pain, sinus congestion and sinus pain, sore throat, dysphagia,  hemoptysis , cough, dyspnea, wheezing, chest pain, palpitations, orthopnea, edema, abdominal pain, nausea, melena, diarrhea, constipation, flank pain, dysuria, hematuria, urinary  Frequency, nocturia, numbness, tingling, seizures,  Focal weakness, Loss of consciousness,  Tremor, insomnia, depression, anxiety, and suicidal ideation.  Objective:  BP 128/88 (BP Location: Left Arm, Patient Position: Sitting, Cuff Size: Normal)   Pulse 79   Temp 98.2 F (36.8 C) (Oral)   Resp 15   Ht 6\' 2"  (1.88 m)   Wt 225 lb 6.4 oz (102.2 kg)   SpO2 98%   BMI 28.94 kg/m   BP Readings from Last 3 Encounters:  10/11/17 128/88  09/13/17 130/84  06/02/17 128/88    Wt Readings from Last 3 Encounters:  10/11/17 225 lb 6.4 oz (102.2 kg)  09/13/17 224 lb 6 oz (101.8 kg)  06/02/17 221 lb 3.2 oz (100.3 kg)    General appearance: alert, cooperative and appears stated age Ears: normal TM's and external ear canals both ears Throat: lips,  mucosa, and tongue normal; teeth and gums normal Neck: no adenopathy, no carotid bruit, supple, symmetrical, trachea midline and thyroid not enlarged, symmetric, no tenderness/mass/nodules Back: symmetric, no curvature. ROM normal. No CVA tenderness. Lungs: clear to auscultation bilaterally Heart: regular rate and rhythm, S1, S2 normal, no murmur, click, rub or gallop Abdomen: soft, non-tender; bowel sounds normal; no masses,  no organomegaly Pulses: 2+ and symmetric Skin: Skin color, texture, turgor normal. No rashes or lesions Lymph nodes: Cervical, supraclavicular, and axillary nodes normal.  No results found for: HGBA1C  Lab Results  Component Value Date   CREATININE 0.95 06/25/2017   CREATININE 0.94 02/17/2016   CREATININE 1.00 10/17/2015    Lab Results  Component Value Date   WBC 5.9 10/17/2015   HGB 13.8 10/17/2015   HCT 41.4 10/17/2015   PLT 272.0 10/17/2015   GLUCOSE 149 (H) 06/25/2017   CHOL 176 06/02/2017   TRIG 83.0 06/02/2017   HDL 41.20 06/02/2017   LDLCALC 118 (H) 06/02/2017   ALT 35 06/25/2017   AST 23 06/25/2017   NA 137 06/25/2017   K 3.8 06/25/2017   CL 101 06/25/2017   CREATININE 0.95 06/25/2017   BUN 14 06/25/2017   CO2 32 06/25/2017   TSH 2.32 10/17/2015   PSA 0.92 06/02/2017   MICROALBUR <0.7 06/02/2017    Ct Cardiac Scoring  Addendum Date: 05/15/2016   ADDENDUM REPORT: 05/15/2016 10:28 CLINICAL DATA:  Risk stratification EXAM: Coronary Calcium Score TECHNIQUE: The patient was scanned on a Siemens Somatom 64 slice scanner. Axial non-contrast 3 mm slices were carried out through the heart. The data set was analyzed on a dedicated work station and scored using the Agatson method. FINDINGS: Non-cardiac: [facility]. Ascending Aorta:  Normal size, no calcifications. Pericardium: Normal. Coronary arteries:  Normal origin. IMPRESSION: Coronary calcium score of 0. This was 0 percentile for age and sex matched control.  Tobias AlexanderKatarina Nelson Electronically Signed   By: Tobias AlexanderKatarina  Nelson   On: 05/15/2016 10:28   Result Date: 05/15/2016 EXAM: OVER-READ INTERPRETATION  CT CHEST The following report is an over-read performed by radiologist Dr. Noe [facility]Kevin [facility] on 05/15/2016. This over-read does not include interpretation of cardiac or coronary anatomy or pathology. The coronary calcium score interpretation by the cardiologist is attached. COMPARISON:  None. FINDINGS: Cardiovascular: Heart is normal size. Visualized aorta normal caliber. Mediastinum/Nodes: No adenopathy in the lower mediastinum or hila. Lungs/Pleura: Visualized lungs are clear.  No effusions. Upper Abdomen: Imaging into the upper abdomen shows no acute findings. Musculoskeletal: Chest wall soft tissues and bony structures unremarkable. IMPRESSION: No acute or significant extracardiac abnormality. Electronically Signed: By: Charlett NoseKevin  Dover M.D. 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  He did not tolerate change to wellbutrin due to increased aggressiveness/irritabiity and has resumed zoloft. Discussed the lack of available therapies      Cough due to ACE inhibitor    Presumed given failure to resolve with treatment of bronchitis,  Chest x ray  Done today was normal.  Will suspend lisinopril , repeat prednisone taper and prescribe benzonotate and singulair ,  Continue daily antihistamine,  rtc one month .       Family history of early CAD    His Noninvasive workup for occult CAD was negative in Feb 2018 , including Coronary calcium scoring  Of zero and normal ECHO       Other Visit Diagnoses    Cough    -  Primary   Relevant Orders   DG Chest 2 View (Completed)    A total of 25 minutes of face to face time was spent with patient more than half of which was spent in counselling about the above mentioned conditions  and coordination of care     I have discontinued Onalee Hua Liby's buPROPion, lisinopril, predniSONE, and predniSONE. I am also having him start on losartan, benzonatate, predniSONE, and montelukast. Additionally, I am having him maintain his acetaminophen, fluticasone, traMADol, celecoxib, carvedilol, albuterol, and sertraline.  Meds ordered this encounter  Medications  . losartan (COZAAR) 50 MG tablet    Sig: Take 1 tablet (50 mg total) by mouth daily.    Dispense:  90 tablet    Refill:  3  . benzonatate (TESSALON) 200 MG capsule    Sig: Take 1 capsule (200 mg total) by mouth 3 (three) times daily as needed for cough.    Dispense:  60 capsule    Refill:  1  . predniSONE (DELTASONE) 10 MG tablet    Sig: 6 tablets on Day 1 , then reduce by 1 tablet daily until gone    Dispense:  21 tablet    Refill:  0  . montelukast (SINGULAIR) 10 MG tablet    Sig: Take 1 tablet (10 mg total) by mouth at bedtime.    Dispense:  30 tablet    Refill:  3    Medications Discontinued During This Encounter  Medication Reason  . buPROPion (WELLBUTRIN SR) 150 MG 12 hr tablet Change in therapy  . predniSONE (DELTASONE) 10 MG tablet Completed Course  . predniSONE (DELTASONE) 10 MG tablet Completed Course  . lisinopril (PRINIVIL,ZESTRIL) 10 MG tablet     Follow-up: Return in about 1 month (around 11/08/2017).   Sherlene Shams, MD

## 2017-10-11 NOTE — Patient Instructions (Signed)
Stop the lisinopril  Start losartan 50  Mg daily instead for BP control  Prednisone taper,  singulair once daily,  And tessalon perles (benzonatate) to suppress the cough and resolve any inflammation  Chest x ray today

## 2017-10-12 DIAGNOSIS — R058 Other specified cough: Secondary | ICD-10-CM | POA: Insufficient documentation

## 2017-10-12 DIAGNOSIS — F5232 Male orgasmic disorder: Secondary | ICD-10-CM | POA: Insufficient documentation

## 2017-10-12 DIAGNOSIS — R05 Cough: Secondary | ICD-10-CM | POA: Insufficient documentation

## 2017-10-12 DIAGNOSIS — T464X5A Adverse effect of angiotensin-converting-enzyme inhibitors, initial encounter: Secondary | ICD-10-CM | POA: Insufficient documentation

## 2017-10-12 NOTE — Assessment & Plan Note (Signed)
His Noninvasive workup for occult CAD was negative in Feb 2018 , including Coronary calcium scoring  Of zero and normal ECHO

## 2017-10-12 NOTE — Assessment & Plan Note (Signed)
Presumed given failure to resolve with treatment of bronchitis,  Chest x ray  Done today was normal.  Will suspend lisinopril , repeat prednisone taper and prescribe benzonotate and singulair ,  Continue daily antihistamine,  rtc one month .

## 2017-10-12 NOTE — Assessment & Plan Note (Addendum)
Secondary to SSRI therapy.  He did not tolerate change to wellbutrin due to increased aggressiveness/irritabiity and has resumed zoloft. Discussed the lack of available therapies

## 2017-12-01 ENCOUNTER — Ambulatory Visit: Payer: Managed Care, Other (non HMO) | Admitting: Internal Medicine

## 2017-12-01 ENCOUNTER — Encounter: Payer: Self-pay | Admitting: Internal Medicine

## 2017-12-01 VITALS — BP 128/88 | HR 76 | Temp 98.3°F | Resp 15 | Ht 74.0 in | Wt 225.0 lb

## 2017-12-01 DIAGNOSIS — I10 Essential (primary) hypertension: Secondary | ICD-10-CM

## 2017-12-01 DIAGNOSIS — Z23 Encounter for immunization: Secondary | ICD-10-CM | POA: Diagnosis not present

## 2017-12-01 MED ORDER — CELECOXIB 200 MG PO CAPS: 200.0000 mg | ORAL_CAPSULE | Freq: Every day | ORAL | 1 refills | Status: DC

## 2017-12-01 MED ORDER — MONTELUKAST SODIUM 10 MG PO TABS: 10.0000 mg | ORAL_TABLET | Freq: Every day | ORAL | 3 refills | Status: DC

## 2017-12-01 NOTE — Patient Instructions (Signed)
I'M  GLAD THE COUGH HAS RESOLVED  NO CHANGES TODAY  RETURN AFTER MARCH 6 FOR YOUR ANNUAL CPE

## 2017-12-01 NOTE — Progress Notes (Signed)
Subjective:  Patient ID: Kaevion Sinclair, male    DOB: Jun 07, 1960  Age: 57 y.o. MRN: [id number]  CC: The primary encounter diagnosis was Need for influenza vaccination. A diagnosis of Essential hypertension was also pertinent to this visit.  HPI Byard Carranza presents for follow up on cough presumed due to lisinopril,  and hypertension.  His cough resolved within one week of stopping lisinopril and starting losartan.  He has had no side effects or recurrence of cough.  Hypertension : patient checks blood pressure twice weekly at home.  Readings have been for the most part <140/80 at rest . Patient is following a reduced salt diet most days and is taking medications as prescribed(carvedilol and losartan  (recent change from lisinopril to losartan due to cough   Outpatient Medications Prior to Visit  Medication Sig Dispense Refill  . acetaminophen (TYLENOL) 500 MG tablet Take 1,000 mg by mouth every 6 (six) hours as needed.    Marland Kitchen albuterol (PROVENTIL HFA;VENTOLIN HFA) 108 (90 Base) MCG/ACT inhaler Inhale 2 puffs into the lungs every 6 (six) hours as needed for wheezing or shortness of breath. 18 g 2  . carvedilol (COREG) 3.125 MG tablet Take 1 tablet (3.125 mg total) by mouth 2 (two) times daily with a meal. 180 tablet 3  . fluticasone (FLONASE) 50 MCG/ACT nasal spray Place 2 sprays into both nostrils daily. 16 g 3  . losartan (COZAAR) 50 MG tablet Take 1 tablet (50 mg total) by mouth daily. 90 tablet 3  . sertraline (ZOLOFT) 50 MG tablet TAKE 1 TABLET BY MOUTH ONCE DAILY WITH BREAKFAST  0  . traMADol (ULTRAM) 50 MG tablet Take 1 tablet (50 mg total) by mouth every 8 (eight) hours as needed. 120 tablet 3  . celecoxib (CELEBREX) 200 MG capsule TAKE 1 CAPSULE BY MOUTH ONCE DAILY 30 capsule 5  . montelukast (SINGULAIR) 10 MG tablet Take 1 tablet (10 mg total) by mouth at bedtime. 30 tablet 3  . benzonatate (TESSALON) 200 MG capsule Take 1 capsule (200 mg total) by mouth 3 (three) times daily as needed for  cough. (Patient not taking: Reported on 12/01/2017) 60 capsule 1  . predniSONE (DELTASONE) 10 MG tablet 6 tablets on Day 1 , then reduce by 1 tablet daily until gone (Patient not taking: Reported on 12/01/2017) 21 tablet 0   No facility-administered medications prior to visit.     Review of Systems;  Patient denies headache, fevers, malaise, unintentional weight loss, skin rash, eye pain, sinus congestion and sinus pain, sore throat, dysphagia,  hemoptysis , cough, dyspnea, wheezing, chest pain, palpitations, orthopnea, edema, abdominal pain, nausea, melena, diarrhea, constipation, flank pain, dysuria, hematuria, urinary  Frequency, nocturia, numbness, tingling, seizures,  Focal weakness, Loss of consciousness,  Tremor, insomnia, depression, anxiety, and suicidal ideation.      Objective:  BP 128/88 (BP Location: Left Arm, Patient Position: Sitting, Cuff Size: Normal)   Pulse 76   Temp 98.3 F (36.8 C) (Oral)   Resp 15   Ht 6\' 2"  (1.88 m)   Wt 225 lb (102.1 kg)   SpO2 95%   BMI 28.89 kg/m   BP Readings from Last 3 Encounters:  12/01/17 128/88  10/11/17 128/88  09/13/17 130/84    Wt Readings from Last 3 Encounters:  12/01/17 225 lb (102.1 kg)  10/11/17 225 lb 6.4 oz (102.2 kg)  09/13/17 224 lb 6 oz (101.8 kg)    General appearance: alert, cooperative and appears stated age Ears: normal TM's and external  ear canals both ears Throat: lips, mucosa, and tongue normal; teeth and gums normal Neck: no adenopathy, no carotid bruit, supple, symmetrical, trachea midline and thyroid not enlarged, symmetric, no tenderness/mass/nodules Back: symmetric, no curvature. ROM normal. No CVA tenderness. Lungs: clear to auscultation bilaterally Heart: regular rate and rhythm, S1, S2 normal, no murmur, click, rub or gallop Abdomen: soft, non-tender; bowel sounds normal; no masses,  no organomegaly Pulses: 2+ and symmetric Skin: Skin color, texture, turgor normal. No rashes or lesions Lymph nodes:  Cervical, supraclavicular, and axillary nodes normal.  No results found for: HGBA1C  Lab Results  Component Value Date   CREATININE 0.95 06/25/2017   CREATININE 0.94 02/17/2016   CREATININE 1.00 10/17/2015    Lab Results  Component Value Date   WBC 5.9 10/17/2015   HGB 13.8 10/17/2015   HCT 41.4 10/17/2015   PLT 272.0 10/17/2015   GLUCOSE 149 (H) 06/25/2017   CHOL 176 06/02/2017   TRIG 83.0 06/02/2017   HDL 41.20 06/02/2017   LDLCALC 118 (H) 06/02/2017   ALT 35 06/25/2017   AST 23 06/25/2017   NA 137 06/25/2017   K 3.8 06/25/2017   CL 101 06/25/2017   CREATININE 0.95 06/25/2017   BUN 14 06/25/2017   CO2 32 06/25/2017   TSH 2.32 10/17/2015   PSA 0.92 06/02/2017   MICROALBUR <0.7 06/02/2017    Assessment & Plan:   Problem List Items Addressed This Visit    Essential hypertension    Well controlled on losartan and carvedilol.  .  ACE Inhibitor cough has resolved.        Other Visit Diagnoses    Need for influenza vaccination    -  Primary   Relevant Orders   Flu Vaccine QUAD 6+ mos PF IM (Fluarix Quad PF) (Completed)      I have discontinued Onalee Hua Kinzie's benzonatate and predniSONE. I have also changed his celecoxib. Additionally, I am having him maintain his acetaminophen, fluticasone, traMADol, carvedilol, albuterol, sertraline, losartan, and montelukast.  Meds ordered this encounter  Medications  . montelukast (SINGULAIR) 10 MG tablet    Sig: Take 1 tablet (10 mg total) by mouth at bedtime.    Dispense:  90 tablet    Refill:  3  . celecoxib (CELEBREX) 200 MG capsule    Sig: Take 1 capsule (200 mg total) by mouth daily.    Dispense:  90 capsule    Refill:  1    KEEP ON FILE FOR FUTURE REFILLS    Medications Discontinued During This Encounter  Medication Reason  . benzonatate (TESSALON) 200 MG capsule Patient has not taken in last 30 days  . predniSONE (DELTASONE) 10 MG tablet Completed Course  . montelukast (SINGULAIR) 10 MG tablet Reorder  .  celecoxib (CELEBREX) 200 MG capsule Reorder    Follow-up: No follow-ups on file.   Sherlene Shams, MD

## 2017-12-04 NOTE — Assessment & Plan Note (Addendum)
Well controlled on losartan and carvedilol.  .  ACE Inhibitor cough has resolved.

## 2018-01-06 ENCOUNTER — Other Ambulatory Visit: Payer: Self-pay

## 2018-01-06 MED ORDER — MONTELUKAST SODIUM 10 MG PO TABS: 10.0000 mg | ORAL_TABLET | Freq: Every day | ORAL | 3 refills | Status: DC

## 2018-01-17 ENCOUNTER — Ambulatory Visit (INDEPENDENT_AMBULATORY_CARE_PROVIDER_SITE_OTHER): Payer: Managed Care, Other (non HMO) | Admitting: Internal Medicine

## 2018-01-17 ENCOUNTER — Encounter: Payer: Self-pay | Admitting: Internal Medicine

## 2018-01-17 DIAGNOSIS — L245 Irritant contact dermatitis due to other chemical products: Secondary | ICD-10-CM | POA: Diagnosis not present

## 2018-01-17 MED ORDER — PREDNISONE 10 MG PO TABS: ORAL_TABLET | ORAL | 0 refills | Status: DC

## 2018-01-17 MED ORDER — HYDROXYZINE HCL 25 MG PO TABS: 25.0000 mg | ORAL_TABLET | Freq: Three times a day (TID) | ORAL | 0 refills | Status: DC | PRN

## 2018-01-17 NOTE — Patient Instructions (Signed)
Prednisone 60 mg DAILY for 3 days,  Then begin the 10 mg daily taper  hydoxyzine 25 mg every 8 hours as needed for itching   Switch or add one of the 2nd generation antihistamines if needed   Call if you develop open sores or spreading redness (cellulitis)   Contact Dermatitis Dermatitis is redness, soreness, and swelling (inflammation) of the skin. Contact dermatitis is a reaction to certain substances that touch the skin. You either touched something that irritated your skin, or you have allergies to something you touched. Follow these instructions at home: Skin Care  Moisturize your skin as needed.  Apply cool compresses to the affected areas.  Try taking a bath with: ? Epsom salts. Follow the instructions on the package. You can get these at a pharmacy or grocery store. ? Baking soda. Pour a small amount into the bath as told by your doctor. ? Colloidal oatmeal. Follow the instructions on the package. You can get this at a pharmacy or grocery store.  Try applying baking soda paste to your skin. Stir water into baking soda until it looks like paste.  Do not scratch your skin.  Bathe less often.  Bathe in lukewarm water. Avoid using hot water. Medicines  Take or apply over-the-counter and prescription medicines only as told by your doctor.  If you were prescribed an antibiotic medicine, take or apply your antibiotic as told by your doctor. Do not stop taking the antibiotic even if your condition starts to get better. General instructions  Keep all follow-up visits as told by your doctor. This is important.  Avoid the substance that caused your reaction. If you do not know what caused it, keep a journal to try to track what caused it. Write down: ? What you eat. ? What cosmetic products you use. ? What you drink. ? What you wear in the affected area. This includes jewelry.  If you were given a bandage (dressing), take care of it as told by your doctor. This includes  when to change and remove it. Contact a doctor if:  You do not get better with treatment.  Your condition gets worse.  You have signs of infection such as: ? Swelling. ? Tenderness. ? Redness. ? Soreness. ? Warmth.  You have a fever.  You have new symptoms. Get help right away if:  You have a very bad headache.  You have neck pain.  Your neck is stiff.  You throw up (vomit).  You feel very sleepy.  You see red streaks coming from the affected area.  Your bone or joint underneath the affected area becomes painful after the skin has healed.  The affected area turns darker.  You have trouble breathing. This information is not intended to replace advice given to you by your health care provider. Make sure you discuss any questions you have with your health care provider. Document Released: 01/11/2009 Document Revised: 08/22/2015 Document Reviewed: 08/01/2014 Elsevier Interactive Patient Education  2018 [employer].

## 2018-01-17 NOTE — Progress Notes (Signed)
Subjective:  Patient ID: Kevin Barry, male    DOB: 10/29/1960  Age: 57 y.o. MRN: [id number]  CC: The encounter diagnosis was Contact dermatitis due to insecticide.  HPI Kevin Barry presents for evaluation of a generalized papular pruritic  rash that has been present for 2 days,  Started after working in the yard on Saturday.  Started on arms  Spread to legs trunk, and neck .  No fevers,  No, Headache , no known tick exposure.  No contact with poison ivy .  No recent detergent or soap changes.  symptoms started after encountering a large cloud of gnats in the parking lot at [facility]   Outpatient Medications Prior to Visit  Medication Sig Dispense Refill  . acetaminophen (TYLENOL) 500 MG tablet Take 1,000 mg by mouth every 6 (six) hours as needed.    Marland Kitchen albuterol (PROVENTIL HFA;VENTOLIN HFA) 108 (90 Base) MCG/ACT inhaler Inhale 2 puffs into the lungs every 6 (six) hours as needed for wheezing or shortness of breath. 18 g 2  . carvedilol (COREG) 3.125 MG tablet Take 1 tablet (3.125 mg total) by mouth 2 (two) times daily with a meal. 180 tablet 3  . celecoxib (CELEBREX) 200 MG capsule Take 1 capsule (200 mg total) by mouth daily. 90 capsule 1  . fluticasone (FLONASE) 50 MCG/ACT nasal spray Place 2 sprays into both nostrils daily. 16 g 3  . losartan (COZAAR) 50 MG tablet Take 1 tablet (50 mg total) by mouth daily. 90 tablet 3  . montelukast (SINGULAIR) 10 MG tablet Take 1 tablet (10 mg total) by mouth at bedtime. 90 tablet 3  . sertraline (ZOLOFT) 50 MG tablet TAKE 1 TABLET BY MOUTH ONCE DAILY WITH BREAKFAST  0  . traMADol (ULTRAM) 50 MG tablet Take 1 tablet (50 mg total) by mouth every 8 (eight) hours as needed. 120 tablet 3   No facility-administered medications prior to visit.     Review of Systems;  Patient denies headache, fevers, malaise, unintentional weight loss, skin rash, eye pain, sinus congestion and sinus pain, sore throat, dysphagia,  hemoptysis , cough, dyspnea, wheezing, chest  pain, palpitations, orthopnea, edema, abdominal pain, nausea, melena, diarrhea, constipation, flank pain, dysuria, hematuria, urinary  Frequency, nocturia, numbness, tingling, seizures,  Focal weakness, Loss of consciousness,  Tremor, insomnia, depression, anxiety, and suicidal ideation.      Objective:  BP 124/88 (BP Location: Left Arm, Patient Position: Sitting, Cuff Size: Normal)   Pulse 75   Temp 97.8 F (36.6 C) (Oral)   Resp 15   Ht 6\' 2"  (1.88 m)   Wt 222 lb 6.4 oz (100.9 kg)   SpO2 97%   BMI 28.55 kg/m   BP Readings from Last 3 Encounters:  01/17/18 124/88  12/01/17 128/88  10/11/17 128/88    Wt Readings from Last 3 Encounters:  01/17/18 222 lb 6.4 oz (100.9 kg)  12/01/17 225 lb (102.1 kg)  10/11/17 225 lb 6.4 oz (102.2 kg)    General appearance: alert, cooperative and appears stated age Ears: normal TM's and external ear canals both ears Throat: lips, mucosa, and tongue normal; teeth and gums normal Neck: no adenopathy, no carotid bruit, supple, symmetrical, trachea midline and thyroid not enlarged, symmetric, no tenderness/mass/nodules Back: symmetric, no curvature. ROM normal. No CVA tenderness. Lungs: clear to auscultation bilaterally Heart: regular rate and rhythm, S1, S2 normal, no murmur, click, rub or gallop Abdomen: soft, non-tender; bowel sounds normal; no masses,  no organomegaly Pulses: 2+ and symmetric Skin: diffuse  papular erythematous rash covering arms legs,  Neck and sparing palms and soles  Lymph nodes: Cervical, supraclavicular, and axillary nodes normal.   Assessment & Plan:   Problem List Items Addressed This Visit    Contact dermatitis due to insecticide    Irritant unknown but distribution suggests an airborne irritant.   Prednisone 60 mg daily  x 3, then taper by 10 mg daily.  Hydroxyzine prn itching.          I am having Kevin Barry start on predniSONE and hydrOXYzine. I am also having him maintain his acetaminophen, fluticasone,  traMADol, carvedilol, albuterol, sertraline, losartan, celecoxib, and montelukast.  Meds ordered this encounter  Medications  . predniSONE (DELTASONE) 10 MG tablet    Sig: 6 tablets  For 3 days  , then reduce by 1 tablet daily until gone    Dispense:  33 tablet    Refill:  0  . hydrOXYzine (ATARAX/VISTARIL) 25 MG tablet    Sig: Take 1 tablet (25 mg total) by mouth 3 (three) times daily as needed.    Dispense:  30 tablet    Refill:  0    There are no discontinued medications.  Follow-up: No follow-ups on file.   Sherlene Shams, MD

## 2018-01-17 NOTE — Assessment & Plan Note (Addendum)
Irritant unknown but distribution suggests an airborne irritant.   Prednisone 60 mg daily  x 3, then taper by 10 mg daily.  Hydroxyzine prn itching.

## 2018-01-18 MED ORDER — BUPROPION HCL ER (SR) 150 MG PO TB12: 150.0000 mg | ORAL_TABLET | Freq: Two times a day (BID) | ORAL | 0 refills | Status: DC

## 2018-01-19 MED ORDER — CEPHALEXIN 500 MG PO CAPS: 500.0000 mg | ORAL_CAPSULE | Freq: Four times a day (QID) | ORAL | 0 refills | Status: DC

## 2018-01-19 NOTE — Telephone Encounter (Signed)
Wellbutrin was sent over to pharmacy instead of cephalexin as patient was told was being sent. He has denied wellbutrin and awaiting correct med

## 2018-01-19 NOTE — Addendum Note (Signed)
Addended by: Sherlene Shams on: 01/19/2018 12:55 PM   Modules accepted: Orders

## 2018-03-14 MED ORDER — LOSARTAN POTASSIUM 50 MG PO TABS: 50.0000 mg | ORAL_TABLET | Freq: Every day | ORAL | 1 refills | Status: DC

## 2018-03-25 MED ORDER — CARVEDILOL 3.125 MG PO TABS: 3.1250 mg | ORAL_TABLET | Freq: Two times a day (BID) | ORAL | 3 refills | Status: DC

## 2018-03-25 MED ORDER — MELOXICAM 15 MG PO TABS: 15.0000 mg | ORAL_TABLET | Freq: Every day | ORAL | 1 refills | Status: DC

## 2018-05-04 ENCOUNTER — Encounter: Payer: Self-pay | Admitting: Family Medicine

## 2018-05-04 ENCOUNTER — Ambulatory Visit (INDEPENDENT_AMBULATORY_CARE_PROVIDER_SITE_OTHER): Payer: BLUE CROSS/BLUE SHIELD | Admitting: Family Medicine

## 2018-05-04 VITALS — BP 150/88 | HR 81 | Temp 98.3°F | Resp 18 | Ht 74.0 in | Wt 226.6 lb

## 2018-05-04 DIAGNOSIS — J029 Acute pharyngitis, unspecified: Secondary | ICD-10-CM | POA: Diagnosis not present

## 2018-05-04 LAB — POC INFLUENZA A&B (BINAX/QUICKVUE)
Influenza A, POC: NEGATIVE
Influenza B, POC: NEGATIVE

## 2018-05-04 LAB — POCT RAPID STREP A (OFFICE): Rapid Strep A Screen: NEGATIVE

## 2018-05-04 MED ORDER — PREDNISONE 10 MG PO TABS: ORAL_TABLET | ORAL | 0 refills | Status: DC

## 2018-05-04 MED ORDER — AZITHROMYCIN 250 MG PO TABS: ORAL_TABLET | ORAL | 0 refills | Status: DC

## 2018-05-04 NOTE — Progress Notes (Signed)
Subjective:    Patient ID: Kevin Barry, male    DOB: 28-Oct-1960, 58 y.o.   MRN: [id number]  HPI   Patient presents to clinic complaining of sore throat for the past 2 weeks.  Patient thought at first it was just a common cold, usually can get over this on his own with using over-the-counter cold medication, drinking lots of fluids.  Sore throat continues to persist, patient does talk a lot for his job and yesterday due to his throat being so sore he lost his voice.  States he looked in back of throat with flashlight did see some white patches.  Denies fever or chills.  Denies nausea vomiting or diarrhea.  Denies cough, chest pain, shortness of breath or wheezing.  Patient Active Problem List   Diagnosis Date Noted  . Contact dermatitis due to insecticide 01/17/2018  . Anorgasmia of male 10/12/2017  . Impaired fasting glucose 06/27/2017  . Left shoulder pain 12/15/2016  . Osteoarthritis of both hands 11/19/2016  . Family history of early CAD 01/30/2016  . Anxiety state 10/20/2015  . Pain in joint, shoulder region 10/20/2015  . Insomnia secondary to depression with anxiety 05/23/2015  . Encounter for screening colonoscopy 07/25/2014  . Essential hypertension 07/08/2014  . Encounter for preventive health examination 07/08/2014  . Major depressive disorder, single episode 07/08/2014  . Keratoconus of both eyes 07/08/2014   Social History   Tobacco Use  . Smoking status: Never Smoker  . Smokeless tobacco: Never Used  Substance Use Topics  . Alcohol use: Yes    Comment: maybe 1 glass of wine once a month   Review of Systems  Constitutional: Negative for chills, fatigue and fever.  HENT: Negative for congestion, ear pain, sinus pain. +sore throat, loss of voice Eyes: Negative.   Respiratory: Negative for cough, shortness of breath and wheezing.   Cardiovascular: Negative for chest pain, palpitations and leg swelling.  Gastrointestinal: Negative for abdominal pain, diarrhea,  nausea and vomiting.  Genitourinary: Negative for dysuria, frequency and urgency.  Musculoskeletal: Negative for arthralgias and myalgias.  Skin: Negative for color change, pallor and rash.  Neurological: Negative for syncope, light-headedness and headaches.  Psychiatric/Behavioral: The patient is not nervous/anxious.       Objective:   Physical Exam Vitals signs and nursing note reviewed.  Constitutional:      General: He is not in acute distress.    Appearance: He is not toxic-appearing or diaphoretic.  HENT:     Head: Normocephalic and atraumatic.     Nose: Congestion and rhinorrhea present.     Mouth/Throat:     Mouth: Mucous membranes are moist. No oral lesions.     Pharynx: Uvula midline. Oropharyngeal exudate and posterior oropharyngeal erythema present. No uvula swelling.     Comments: Has had tonsilectomy Neck:     Musculoskeletal: Normal range of motion and neck supple.     Thyroid: No thyromegaly.  Cardiovascular:     Rate and Rhythm: Normal rate and regular rhythm.  Pulmonary:     Effort: Pulmonary effort is normal. No respiratory distress.     Breath sounds: Normal breath sounds. No wheezing, rhonchi or rales.  Skin:    General: Skin is warm and dry.     Coloration: Skin is not pale.  Neurological:     General: No focal deficit present.     Mental Status: He is alert and oriented to person, place, and time.  Psychiatric:        Mood  and Affect: Mood normal.        Behavior: Behavior normal.    Vitals:   05/04/18 0840  BP: (!) 150/88  Pulse: 81  Resp: 18  Temp: 98.3 F (36.8 C)  SpO2: 98%       Assessment & Plan:    Exudative pharyngitis-point-of-care flu-strep testing are negative in clinic.  However, due to length of time with symptoms, patient's physical exam we will treat with Z-Pak to cover strep throat.  He will also take steroid taper to reduce pain, inflammation and help improve loss of voice symptom.  Advised to get plenty of rest, do good  handwashing and keep up good fluid intake.  Advised he can use Tylenol if needed to calm down any pain.  Also suggested he try salt water gargles.  Patient will keep regularly scheduled follow-up PCP as planned.  Advised to return to clinic sooner if any issues arise or if current symptoms persist or worsen.

## 2018-05-18 DIAGNOSIS — F330 Major depressive disorder, recurrent, mild: Secondary | ICD-10-CM | POA: Diagnosis not present

## 2018-05-18 DIAGNOSIS — F411 Generalized anxiety disorder: Secondary | ICD-10-CM | POA: Diagnosis not present

## 2018-06-20 ENCOUNTER — Encounter (INDEPENDENT_AMBULATORY_CARE_PROVIDER_SITE_OTHER): Payer: BLUE CROSS/BLUE SHIELD

## 2018-06-20 ENCOUNTER — Other Ambulatory Visit: Payer: Self-pay | Admitting: Internal Medicine

## 2018-06-20 DIAGNOSIS — S76011A Strain of muscle, fascia and tendon of right hip, initial encounter: Secondary | ICD-10-CM

## 2018-06-20 DIAGNOSIS — S39012A Strain of muscle, fascia and tendon of lower back, initial encounter: Secondary | ICD-10-CM | POA: Diagnosis not present

## 2018-06-20 MED ORDER — KETOROLAC TROMETHAMINE 10 MG PO TABS: 10.0000 mg | ORAL_TABLET | Freq: Four times a day (QID) | ORAL | 0 refills | Status: DC | PRN

## 2018-06-21 NOTE — Telephone Encounter (Signed)
A total of 15 minutes of time was spent discussing  patient via mychart, reviewing chart and making treatment     Duncan Dull, MD

## 2018-08-18 ENCOUNTER — Other Ambulatory Visit: Payer: Self-pay | Admitting: Internal Medicine

## 2018-09-16 DIAGNOSIS — F330 Major depressive disorder, recurrent, mild: Secondary | ICD-10-CM | POA: Diagnosis not present

## 2018-09-16 DIAGNOSIS — F411 Generalized anxiety disorder: Secondary | ICD-10-CM | POA: Diagnosis not present

## 2018-09-19 ENCOUNTER — Telehealth: Payer: BLUE CROSS/BLUE SHIELD | Admitting: Physician Assistant

## 2018-09-19 ENCOUNTER — Encounter: Payer: Self-pay | Admitting: Physician Assistant

## 2018-09-19 DIAGNOSIS — W57XXXA Bitten or stung by nonvenomous insect and other nonvenomous arthropods, initial encounter: Secondary | ICD-10-CM | POA: Diagnosis not present

## 2018-09-19 DIAGNOSIS — L0390 Cellulitis, unspecified: Secondary | ICD-10-CM

## 2018-09-19 MED ORDER — MUPIROCIN 2 % EX OINT: 1.0000 "application " | TOPICAL_OINTMENT | Freq: Two times a day (BID) | CUTANEOUS | 0 refills | Status: DC

## 2018-09-19 MED ORDER — MUPIROCIN CALCIUM 2 % EX CREA: 1.0000 "application " | TOPICAL_CREAM | Freq: Two times a day (BID) | CUTANEOUS | 0 refills | Status: DC

## 2018-09-19 NOTE — Addendum Note (Signed)
Addended by: Waldon Merl on: 09/19/2018 11:03 AM   Modules accepted: Orders

## 2018-09-19 NOTE — Progress Notes (Signed)
E Visit for Rash  We are sorry that you are not feeling well. Here is how we plan to help! You may have developed a secondary bacterial infection as evident by fever, redness, and swelling. To treat an infection from the insect bite, topical antibiotic cream, Mupiricin is prescribed, apply twice daily over the area of rash. If you develop any numbness, tingling, loss of sensation, visual changes, eye pain, or any other new symptoms, call your doctor or reach out to [place].      HOME CARE:   Take cool showers and avoid direct sunlight.  Apply cool compress or wet dressings.  Take a bath in an oatmeal bath.  Sprinkle content of one Aveeno packet under running faucet with comfortably warm water.  Bathe for 15-20 minutes, 1-2 times daily.  Pat dry with a towel. Do not rub the rash.  Use hydrocortisone cream.  Take an antihistamine like Benadryl for widespread rashes that itch.  The adult dose of Benadryl is 25-50 mg by mouth 4 times daily.  Caution:  This type of medication may cause sleepiness.  Do not drink alcohol, drive, or operate dangerous machinery while taking antihistamines.  Do not take these medications if you have prostate enlargement.  Read package instructions thoroughly on all medications that you take.  GET HELP RIGHT AWAY IF:   Symptoms don't go away after treatment.  Severe itching that persists.  If you rash spreads or swells.  If you rash begins to smell.  If it blisters and opens or develops a yellow-brown crust.  You develop a fever.  You have a sore throat.  You become short of breath.  MAKE SURE YOU:  Understand these instructions. Will watch your condition. Will get help right away if you are not doing well or get worse.  Thank you for choosing an e-visit. Your e-visit answers were reviewed by a board certified advanced clinical practitioner to complete your personal care plan. Depending upon the condition, your plan could have included both over the  counter or prescription medications. Please review your pharmacy choice. Be sure that the pharmacy you have chosen is open so that you can pick up your prescription now.  If there is a problem you may message your provider in [place] to have the prescription routed to another pharmacy. Your safety is important to [place]. If you have drug allergies check your prescription carefully.  For the next 24 hours, you can use MyChart to ask questions about today's visit, request a non-urgent call back, or ask for a work or school excuse from your e-visit provider. You will get an email in the next two days asking about your experience. I hope that your e-visit has been valuable and will speed your recovery.     I spent 5-10 minutes on review and completion of this note- Lacy Duverney [facility]

## 2018-12-08 ENCOUNTER — Telehealth: Payer: Self-pay | Admitting: Internal Medicine

## 2018-12-08 DIAGNOSIS — Z125 Encounter for screening for malignant neoplasm of prostate: Secondary | ICD-10-CM

## 2018-12-08 DIAGNOSIS — I10 Essential (primary) hypertension: Secondary | ICD-10-CM

## 2018-12-08 DIAGNOSIS — R7301 Impaired fasting glucose: Secondary | ICD-10-CM

## 2018-12-08 DIAGNOSIS — Z0000 Encounter for general adult medical examination without abnormal findings: Secondary | ICD-10-CM

## 2018-12-08 DIAGNOSIS — E785 Hyperlipidemia, unspecified: Secondary | ICD-10-CM

## 2018-12-08 NOTE — Telephone Encounter (Signed)
Pt wants to have fasting lab work. Order needed please and Thank you! Pt is coming in on 01/03/2019 for flu shot. Please advise? Thank you!

## 2018-12-13 NOTE — Addendum Note (Signed)
Addended by: Adair Laundry on: 12/13/2018 01:25 PM   Modules accepted: Orders

## 2018-12-13 NOTE — Telephone Encounter (Signed)
Pt would like to get his labs done when he comes in for his flu shot on 01/03/2019. I have ordered CBC, CMP, A1c, Lipid, PSA TSH, and Microalbumin. Is there anything else that needs to be ordered?

## 2018-12-15 NOTE — Telephone Encounter (Signed)
Left message with pt to let him know that he will be able to have his lab work done on 01/03/2019 after his flu shot.

## 2018-12-15 NOTE — Telephone Encounter (Signed)
Great - thanks

## 2018-12-31 DIAGNOSIS — R0683 Snoring: Secondary | ICD-10-CM

## 2018-12-31 DIAGNOSIS — I10 Essential (primary) hypertension: Secondary | ICD-10-CM

## 2018-12-31 DIAGNOSIS — G4710 Hypersomnia, unspecified: Secondary | ICD-10-CM

## 2018-12-31 DIAGNOSIS — N5089 Other specified disorders of the male genital organs: Secondary | ICD-10-CM

## 2019-01-03 ENCOUNTER — Other Ambulatory Visit (INDEPENDENT_AMBULATORY_CARE_PROVIDER_SITE_OTHER): Payer: BC Managed Care – PPO

## 2019-01-03 ENCOUNTER — Other Ambulatory Visit: Payer: Self-pay

## 2019-01-03 ENCOUNTER — Ambulatory Visit: Payer: BLUE CROSS/BLUE SHIELD

## 2019-01-03 DIAGNOSIS — I10 Essential (primary) hypertension: Secondary | ICD-10-CM

## 2019-01-03 DIAGNOSIS — Z0000 Encounter for general adult medical examination without abnormal findings: Secondary | ICD-10-CM | POA: Diagnosis not present

## 2019-01-03 DIAGNOSIS — R7301 Impaired fasting glucose: Secondary | ICD-10-CM | POA: Diagnosis not present

## 2019-01-03 DIAGNOSIS — Z23 Encounter for immunization: Secondary | ICD-10-CM

## 2019-01-03 DIAGNOSIS — E785 Hyperlipidemia, unspecified: Secondary | ICD-10-CM | POA: Diagnosis not present

## 2019-01-03 DIAGNOSIS — Z125 Encounter for screening for malignant neoplasm of prostate: Secondary | ICD-10-CM | POA: Diagnosis not present

## 2019-01-03 LAB — COMPREHENSIVE METABOLIC PANEL
ALT: 37 U/L (ref 0–53)
AST: 23 U/L (ref 0–37)
Albumin: 4.6 g/dL (ref 3.5–5.2)
Alkaline Phosphatase: 39 U/L (ref 39–117)
BUN: 13 mg/dL (ref 6–23)
CO2: 32 mEq/L (ref 19–32)
Calcium: 9.7 mg/dL (ref 8.4–10.5)
Chloride: 100 mEq/L (ref 96–112)
Creatinine, Ser: 0.93 mg/dL (ref 0.40–1.50)
GFR: 84.07 mL/min (ref >60.00)
Glucose, Bld: 86 mg/dL (ref 70–99)
Potassium: 4 mEq/L (ref 3.5–5.1)
Sodium: 139 mEq/L (ref 135–145)
Total Bilirubin: 0.4 mg/dL (ref 0.2–1.2)
Total Protein: 7.9 g/dL (ref 6.0–8.3)

## 2019-01-03 LAB — CBC WITH DIFFERENTIAL/PLATELET
Basophils Absolute: 0.1 10*3/uL (ref 0.0–0.1)
Basophils Relative: 1.5 % (ref 0.0–3.0)
Eosinophils Absolute: 0.1 10*3/uL (ref 0.0–0.7)
Eosinophils Relative: 2.2 % (ref 0.0–5.0)
HCT: 42.7 % (ref 39.0–52.0)
Hemoglobin: 14 g/dL (ref 13.0–17.0)
Lymphocytes Relative: 28.8 % (ref 12.0–46.0)
Lymphs Abs: 1.5 10*3/uL (ref 0.7–4.0)
MCHC: 32.9 g/dL (ref 30.0–36.0)
MCV: 89.1 fl (ref 78.0–100.0)
Monocytes Absolute: 0.6 10*3/uL (ref 0.1–1.0)
Monocytes Relative: 11 % (ref 3.0–12.0)
Neutro Abs: 3 10*3/uL (ref 1.4–7.7)
Neutrophils Relative %: 56.5 % (ref 43.0–77.0)
Platelets: 264 10*3/uL (ref 150.0–400.0)
RBC: 4.79 Mil/uL (ref 4.22–5.81)
RDW: 12.9 % (ref 11.5–15.5)
WBC: 5.3 10*3/uL (ref 4.0–10.5)

## 2019-01-03 LAB — LIPID PANEL
Cholesterol: 187 mg/dL (ref 0–200)
HDL: 39.5 mg/dL (ref >39.00)
LDL Cholesterol: 122 mg/dL — ABNORMAL HIGH (ref 0–99)
NonHDL: 147.86
Total CHOL/HDL Ratio: 5
Triglycerides: 127 mg/dL (ref 0.0–149.0)
VLDL: 25.4 mg/dL (ref 0.0–40.0)

## 2019-01-03 LAB — HEMOGLOBIN A1C: Hgb A1c MFr Bld: 6.2 % (ref 4.6–6.5)

## 2019-01-03 LAB — MICROALBUMIN / CREATININE URINE RATIO
Creatinine,U: 157 mg/dL
Microalb Creat Ratio: 0.4 mg/g (ref 0.0–30.0)
Microalb, Ur: <0.7 mg/dL (ref 0.0–1.9)

## 2019-01-03 LAB — TSH: TSH: 1.38 u[IU]/mL (ref 0.35–4.50)

## 2019-01-03 LAB — PSA: PSA: 0.99 ng/mL (ref 0.10–4.00)

## 2019-01-03 NOTE — Addendum Note (Signed)
Addended by: Crecencio Mc on: 01/03/2019 10:07 PM   Modules accepted: Orders

## 2019-01-12 ENCOUNTER — Other Ambulatory Visit: Payer: Self-pay

## 2019-01-12 DIAGNOSIS — Z20822 Contact with and (suspected) exposure to covid-19: Secondary | ICD-10-CM

## 2019-01-12 DIAGNOSIS — F411 Generalized anxiety disorder: Secondary | ICD-10-CM | POA: Diagnosis not present

## 2019-01-12 DIAGNOSIS — Z20828 Contact with and (suspected) exposure to other viral communicable diseases: Secondary | ICD-10-CM | POA: Diagnosis not present

## 2019-01-12 DIAGNOSIS — F330 Major depressive disorder, recurrent, mild: Secondary | ICD-10-CM | POA: Diagnosis not present

## 2019-01-14 LAB — NOVEL CORONAVIRUS, NAA: SARS-CoV-2, NAA: NOT DETECTED

## 2019-01-17 ENCOUNTER — Ambulatory Visit: Payer: Self-pay | Admitting: Urology

## 2019-01-31 ENCOUNTER — Ambulatory Visit (INDEPENDENT_AMBULATORY_CARE_PROVIDER_SITE_OTHER): Payer: BC Managed Care – PPO | Admitting: Internal Medicine

## 2019-01-31 ENCOUNTER — Other Ambulatory Visit: Payer: Self-pay

## 2019-01-31 ENCOUNTER — Encounter: Payer: Self-pay | Admitting: Internal Medicine

## 2019-01-31 DIAGNOSIS — I10 Essential (primary) hypertension: Secondary | ICD-10-CM

## 2019-01-31 DIAGNOSIS — R7303 Prediabetes: Secondary | ICD-10-CM | POA: Diagnosis not present

## 2019-01-31 DIAGNOSIS — Z8249 Family history of ischemic heart disease and other diseases of the circulatory system: Secondary | ICD-10-CM | POA: Diagnosis not present

## 2019-01-31 MED ORDER — ALBUTEROL SULFATE HFA 108 (90 BASE) MCG/ACT IN AERS: 2.0000 | INHALATION_SPRAY | Freq: Four times a day (QID) | RESPIRATORY_TRACT | 2 refills | Status: DC | PRN

## 2019-01-31 NOTE — Assessment & Plan Note (Signed)
Well controlled on current regimen. Renal function stable, no changes today.  Lab Results  Component Value Date   CREATININE 0.93 01/03/2019   Lab Results  Component Value Date   NA 139 01/03/2019   K 4.0 01/03/2019   CL 100 01/03/2019   CO2 32 01/03/2019

## 2019-01-31 NOTE — Progress Notes (Signed)
Virtual Visit via [email or web address]  This visit type was conducted due to national recommendations for restrictions regarding the COVID-19 pandemic (e.g. social distancing).  This format is felt to be most appropriate for this patient at this time due to a recent COVID 19 exposure .  All issues noted in this document were discussed and addressed.  No physical exam was performed (except for noted visual exam findings with Video Visits).   I connected with@ on 01/31/19 at  3:30 PM EST by a video enabled telemedicine application and verified that I am speaking with the correct person using two identifiers. Location patient: home Location provider: work or home office Persons participating in the virtual visit: patient, provider  I discussed the limitations, risks, security and privacy concerns of performing an evaluation and management service by telephone and the availability of in person appointments. I also discussed with the patient that there may be a patient responsible charge related to this service. The patient expressed understanding and agreed to proceed.  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  Has well controlled HTN..  indulges in ice cream frequently as his "one vice." has not tried the no sugar added varieties.  Previously physically active but Not exercising regularly since COVID 19 Pandemic started.  has been working from home since March . Acknowledges that while he has become sedentary, that his wife has been very motivated to maintain an exercise grogram and he has recently decided to start exercising with her on a regular basis     ROS: See pertinent positives and negatives per HPI.  Past Medical History:  Diagnosis Date  . Allergy   .  Depression   . Disorder of keratinization    right eye.   Marland Kitchen. History of chicken pox   . Hypertension     Past Surgical History:  Procedure Laterality Date  . COLONOSCOPY WITH PROPOFOL N/A 12/05/2014   Procedure: COLONOSCOPY WITH PROPOFOL;  Surgeon: Earline MayotteJeffrey W Byrnett, MD;  [facility];  Service: Endoscopy;  Laterality: N/A;  . TONSILLECTOMY AND ADENOIDECTOMY      Family History  Problem Relation Age of Onset  . Cancer Mother        Hodgkin disease  . Heart disease Mother 5972       aortic valve replacement  . Heart attack Mother 3274       has stent placed  . Heart disease Father 4856       MI  . Early death Father 1056       acute MI  . Heart attack Father   . Crohn's disease Brother   . Heart disease Maternal Grandmother   . Stroke Maternal Grandmother   . Cancer Maternal Grandmother        unsure which cancer  . Cancer Maternal Grandfather        pancreatic cancer?  . Heart disease Paternal Grandfather   . Stroke Paternal Grandfather   . Down syndrome Daughter     SOCIAL HX:  reports that he has never smoked. He has never used smokeless tobacco. He reports current alcohol use. He reports that he does not use drugs.   Current Outpatient Medications:  .  acetaminophen (TYLENOL) 500 MG tablet, Take 1,000 mg by mouth every 6 (six) hours as needed., Disp: , Rfl:  .  albuterol (VENTOLIN HFA) 108 (90 Base) MCG/ACT inhaler, Inhale 2 puffs into the lungs every 6 (six) hours as needed for wheezing or shortness of breath., Disp: 8 g, Rfl: 2 .  azithromycin (ZITHROMAX) 250 MG tablet, Take 2 tablets on day 1, then take 1 tablet on days 2-5, Disp: 6 tablet, Rfl: 0 .  carvedilol (COREG) 3.125 MG tablet, Take 1 tablet (3.125 mg total) by mouth 2 (two) times daily with a meal., Disp: 180 tablet, Rfl: 3 .  cephALEXin (KEFLEX) 500 MG capsule, Take 1 capsule (500 mg total) by mouth 4 (four) times daily., Disp: 28 capsule, Rfl: 0 .  fluticasone (FLONASE) 50 MCG/ACT nasal spray, Place 2  sprays into both nostrils daily., Disp: 16 g, Rfl: 3 .  hydrOXYzine (ATARAX/VISTARIL) 25 MG tablet, Take 1 tablet (25 mg total) by mouth 3 (three) times daily as needed., Disp: 30 tablet, Rfl: 0 .  ketorolac (TORADOL) 10 MG tablet, Take 1 tablet (10 mg total) by mouth every 6 (six) hours as needed., Disp: 20 tablet, Rfl: 0 .  losartan (COZAAR) 50 MG tablet, TAKE 1 TABLET DAILY, Disp: 90 tablet, Rfl: 1 .  meloxicam (MOBIC) 15 MG tablet, Take 1 tablet (15 mg total) by mouth daily., Disp: 90 tablet, Rfl: 1 .  montelukast (SINGULAIR) 10 MG tablet, Take 1 tablet (10 mg total) by mouth at bedtime., Disp: 90 tablet, Rfl: 3 .  mupirocin ointment (BACTROBAN) 2 %, Place 1 application into the nose 2 (two) times daily., Disp: 22 g, Rfl: 0 .  predniSONE (DELTASONE) 10 MG tablet, 6 tablets  For 3 days  , then reduce by 1 tablet daily until gone, Disp: 33 tablet, Rfl: 0 .  sertraline (ZOLOFT) 50 MG tablet, TAKE 1 TABLET BY MOUTH ONCE DAILY WITH BREAKFAST, Disp: , Rfl: 0 .  traMADol (ULTRAM) 50 MG tablet, Take 1 tablet (50 mg total) by mouth every 8 (eight) hours as needed., Disp: 120 tablet, Rfl: 3  EXAM:  VITALS per patient if applicable:  GENERAL: alert, oriented, appears well and in no acute distress  HEENT: atraumatic, conjunttiva clear, no obvious abnormalities on inspection of external nose and ears  NECK: normal movements of the head and neck  LUNGS: on inspection no signs of respiratory distress, breathing rate appears normal, no obvious gross SOB, gasping or wheezing  CV: no obvious cyanosis  MS: moves all visible extremities without noticeable abnormality  PSYCH/NEURO: pleasant and cooperative, no obvious depression or anxiety, speech and thought processing grossly intact  ASSESSMENT AND PLAN:  Discussed the following assessment and plan:  Prediabetes  Essential hypertension  Family history of early CAD  Prediabetes A total of 25 minutes of face to face time was spent with patient  more than half of which was spent in counselling about the benefits of a  low glycemic index diet and regular particpation in an aerobic  exercise activities.  Patient was advised to return for repeat an A1c in 6 months.   Essential hypertension Well controlled on current regimen. Renal function stable, no changes today.  Lab Results  Component Value Date   CREATININE 0.93 01/03/2019   Lab Results  Component Value Date   NA 139 01/03/2019   K 4.0 01/03/2019   CL 100 01/03/2019   CO2 32 01/03/2019     Family history of early CAD Reviewed His Noninvasive workup for occult  CAD which was negative in Feb 2018 , including Coronary calcium scoring  Of zero and normal ECHO.  He has been encouraged to start a regular exercise regimen given his new diagnosis of prediabetes    I discussed the assessment and treatment plan with the patient. The patient was provided an opportunity to ask questions and all were answered. The patient agreed with the plan and demonstrated an understanding of the instructions.   The patient was advised to call back or seek an in-person evaluation if the symptoms worsen or if the condition fails to improve as anticipated.  I provided  25 minutes of non-face-to-face time during this encounter reviewing patient's current problems and post surgeries.  Providing counseling on the above mentioned problems , and coordination  of care .  Crecencio Mc, MD

## 2019-01-31 NOTE — Assessment & Plan Note (Addendum)
Reviewed His Noninvasive workup for occult CAD which was negative in Feb 2018 , including Coronary calcium scoring  Of zero and normal ECHO.  He has been encouraged to start a regular exercise regimen given his new diagnosis of prediabetes

## 2019-01-31 NOTE — Assessment & Plan Note (Signed)
A total of 25 minutes of face to face time was spent with patient more than half of which was spent in counselling about the benefits of a  low glycemic index diet and regular particpation in an aerobic  exercise activities.  Patient was advised to return for repeat an A1c in 6 months.

## 2019-02-03 ENCOUNTER — Other Ambulatory Visit: Payer: Self-pay | Admitting: *Deleted

## 2019-02-03 DIAGNOSIS — N5089 Other specified disorders of the male genital organs: Secondary | ICD-10-CM

## 2019-02-06 MED ORDER — ALBUTEROL SULFATE HFA 108 (90 BASE) MCG/ACT IN AERS: 2.0000 | INHALATION_SPRAY | Freq: Four times a day (QID) | RESPIRATORY_TRACT | 2 refills | Status: DC | PRN

## 2019-02-07 ENCOUNTER — Ambulatory Visit (INDEPENDENT_AMBULATORY_CARE_PROVIDER_SITE_OTHER): Payer: BC Managed Care – PPO | Admitting: Urology

## 2019-02-07 ENCOUNTER — Other Ambulatory Visit: Payer: Self-pay

## 2019-02-07 ENCOUNTER — Encounter: Payer: Self-pay | Admitting: Urology

## 2019-02-07 VITALS — BP 126/90 | HR 85 | Ht 74.0 in | Wt 222.0 lb

## 2019-02-07 DIAGNOSIS — L723 Sebaceous cyst: Secondary | ICD-10-CM | POA: Diagnosis not present

## 2019-02-07 DIAGNOSIS — N3281 Overactive bladder: Secondary | ICD-10-CM | POA: Diagnosis not present

## 2019-02-07 NOTE — Patient Instructions (Signed)

## 2019-02-07 NOTE — Progress Notes (Signed)
02/07/19 8:46 AM   Kevin Barry [birth year] [id number]  Referring provider: Sherlene Shams, MD [address] [place],  [place] [address]  CC: "Scrotal mass"  HPI: I saw Kevin Barry in urology clinic in consultation for scrotal mass is from Kevin Barry.  He is a healthy 58 year old male who reports a 4 to 6-week history of a small lump under the skin on the left side of the scrotum.  He also reports some new urinary urgency and frequency in the mornings.  The lump on the scrotum is not painful or bothersome.  There is been no drainage.  Regarding his urinary frequency, he thinks this has started since he has been working from home.  He is drinking multiple cups of coffee in the morning and reports some urinary urgency and frequency every 30 minutes to an hour in the mornings.  This improves in the afternoon, and he has no urinary symptoms overnight.  He denies any leakage, weak stream, hematuria, or dysuria.  He denies any weight loss, night sweats, fevers, or other constitutional symptoms.  There are no aggravating or alleviating factors.  Severity is mild.   PMH: Past Medical History:  Diagnosis Date  . Allergy   . Depression   . Disorder of keratinization    right eye.   Marland Kitchen History of chicken pox   . Hypertension     Surgical History: Past Surgical History:  Procedure Laterality Date  . COLONOSCOPY WITH PROPOFOL N/A 12/05/2014   Procedure: COLONOSCOPY WITH PROPOFOL;  Surgeon: Kevin Mayotte, MD;  Location: [facility] ENDOSCOPY;  Service: Endoscopy;  Laterality: N/A;  . TONSILLECTOMY AND ADENOIDECTOMY      Allergies:  Allergies  Allergen Reactions  . Lisinopril Cough  . Wellbutrin [Bupropion] Other (See Comments)    Increased aggressiveness and irritabiity  . Penicillins Rash    Family History: Family History  Problem Relation Age of Onset  . Cancer Mother        Hodgkin disease  . Heart disease Mother 8       aortic valve replacement  . Heart attack Mother 28      has stent placed  . Heart disease Father 38       MI  . Early death Father 95       acute MI  . Heart attack Father   . Crohn's disease Brother   . Heart disease Maternal Grandmother   . Stroke Maternal Grandmother   . Cancer Maternal Grandmother        unsure which cancer  . Cancer Maternal Grandfather        pancreatic cancer?  . Heart disease Paternal Grandfather   . Stroke Paternal Grandfather   . Down syndrome Daughter     Social History:  reports that he has never smoked. He has never used smokeless tobacco. He reports current alcohol use. He reports that he does not use drugs.  ROS: Please see flowsheet from today's date for complete review of systems.  Physical Exam: BP 126/90 (BP Location: Left Arm, Patient Position: Sitting, Cuff Size: Normal)   Pulse 85   Ht 6\' 2"  (1.88 m)   Wt 222 lb (100.7 kg)   BMI 28.50 kg/m    Constitutional:  Alert and oriented, No acute distress. Cardiovascular: No clubbing, cyanosis, or edema. Respiratory: Normal respiratory effort, no increased work of breathing. GI: Abdomen is soft, nontender, nondistended, no abdominal masses GU: Circumcised phallus without lesions.  Widely patent meatus.  Small 5  mm sebaceous cyst at the left superior anterior scrotum just under the skin.  Freely mobile and nontender. Lymph: No cervical or inguinal lymphadenopathy. Skin: No rashes, bruises or suspicious lesions. Neurologic: Grossly intact, no focal deficits, moving all 4 extremities. Psychiatric: Normal mood and affect.   Assessment & Plan:   In summary, Kevin Barry is a healthy 58 year old male with a small 5 mm likely sebaceous cyst just under the skin of the left scrotum.  This is minimally bothersome to him.  We discussed the benign nature of this small cyst.  If he was interested in having this removed in the future, it could easily be done in clinic under local.  We discussed return precautions including drainage from the cyst, significant  enlargement, or new bother.  Regarding his urinary symptoms, we discussed behavioral strategies of minimizing coffee in the mornings, and that it is likely related to his change in routine and working from home with increased coffee intake.  He is minimally bothered by this, and is not interested in starting any medication which is very reasonable.  Follow-up as needed if enlarging scrotal skin cyst or worsening urinary symptoms  A total of 40 minutes were spent face-to-face with the patient, greater than 50% was spent in patient education, counseling, and coordination of care regarding scrotal cyst and urinary symptoms.   Kevin Barry, [facility] [address] [place], [place] [address] [phone]

## 2019-02-14 ENCOUNTER — Other Ambulatory Visit: Payer: Self-pay | Admitting: Internal Medicine

## 2019-02-25 ENCOUNTER — Other Ambulatory Visit: Payer: Self-pay | Admitting: Internal Medicine

## 2019-05-10 DIAGNOSIS — F411 Generalized anxiety disorder: Secondary | ICD-10-CM | POA: Diagnosis not present

## 2019-05-10 DIAGNOSIS — F330 Major depressive disorder, recurrent, mild: Secondary | ICD-10-CM | POA: Diagnosis not present

## 2019-05-12 IMAGING — DX DG CHEST 2V
2 series · 2 of 2 positions shown · non-contrast
Comparison: None.

CLINICAL DATA: Cough and wheezing for 4 weeks.

EXAM:
CHEST - 2 VIEW

[chest pa]
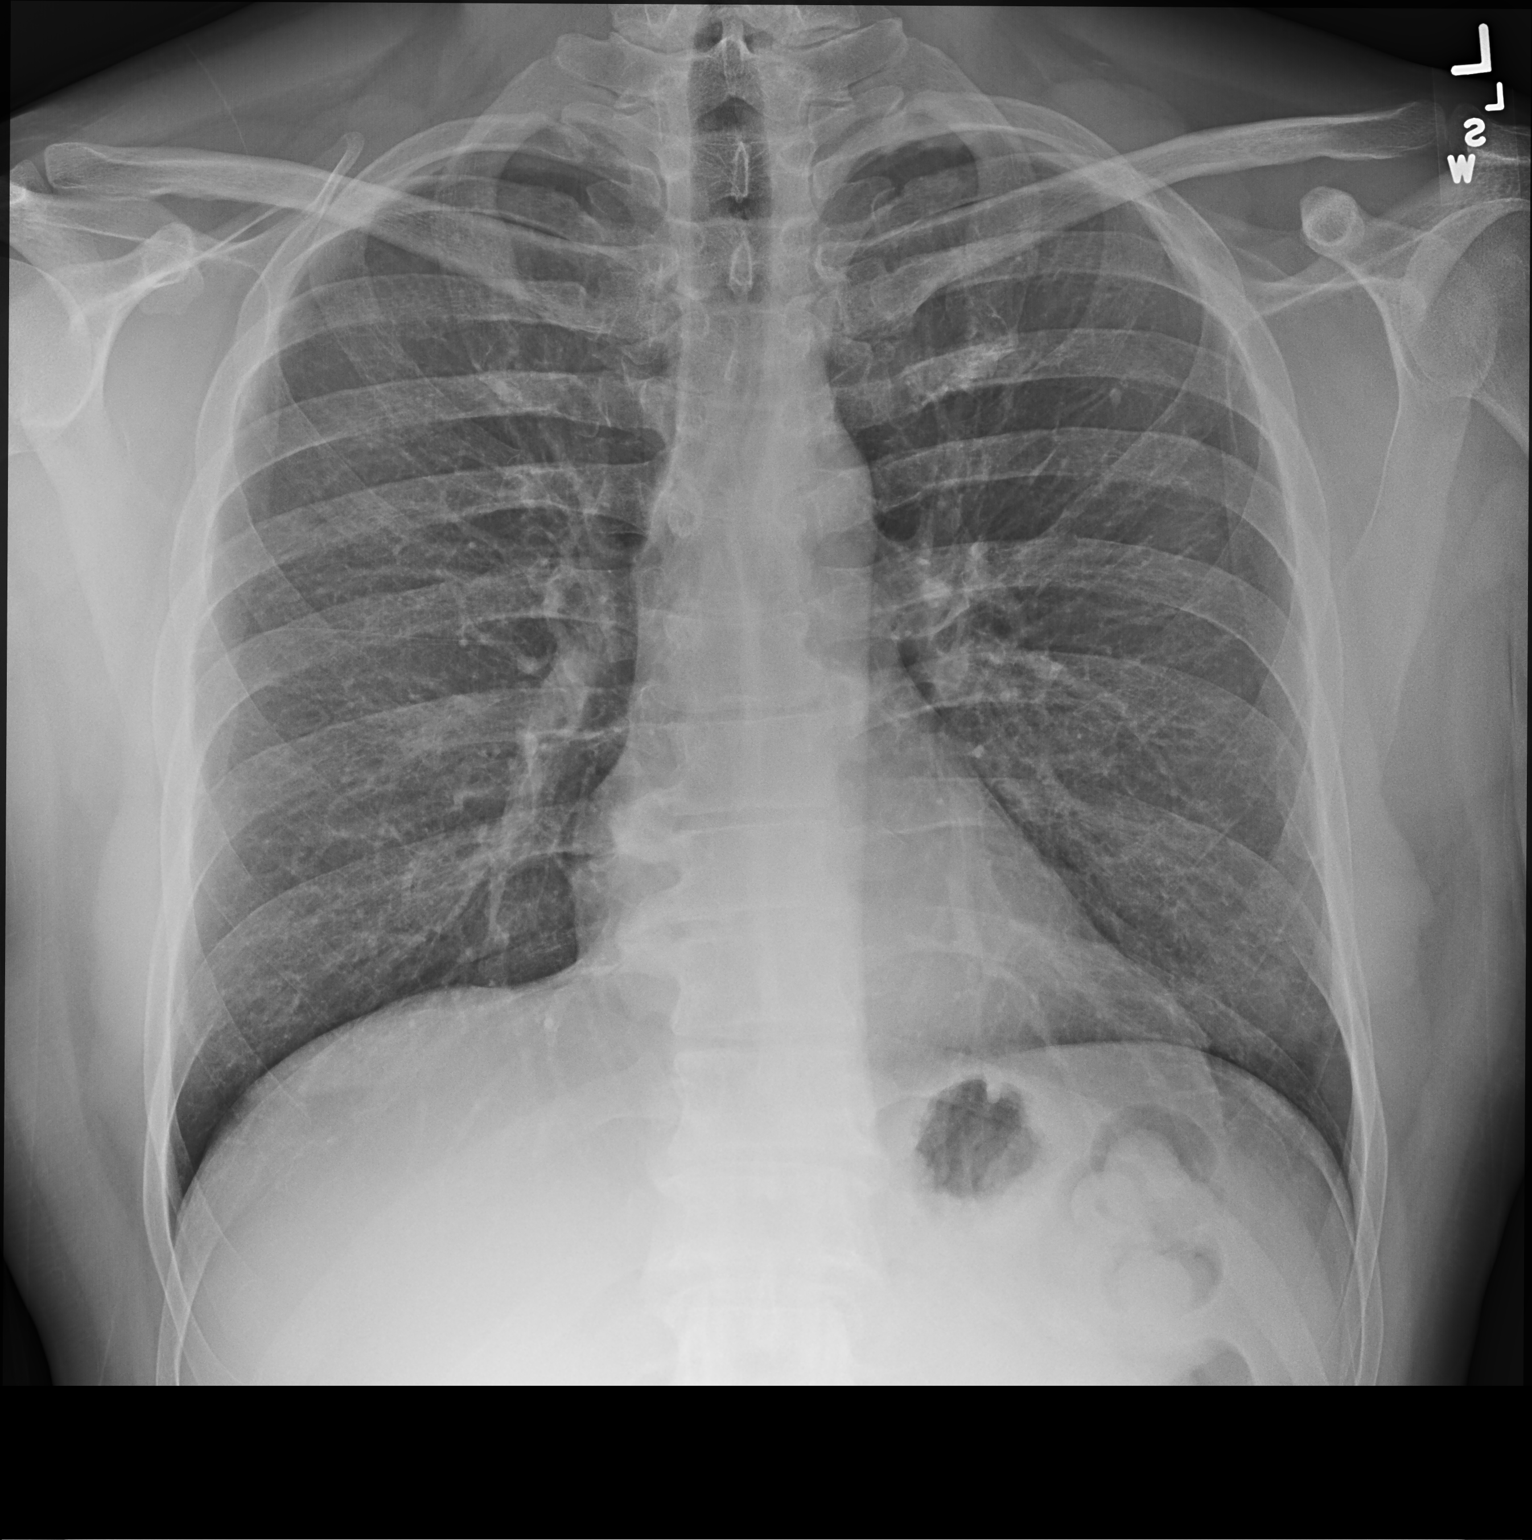

[chest lat]
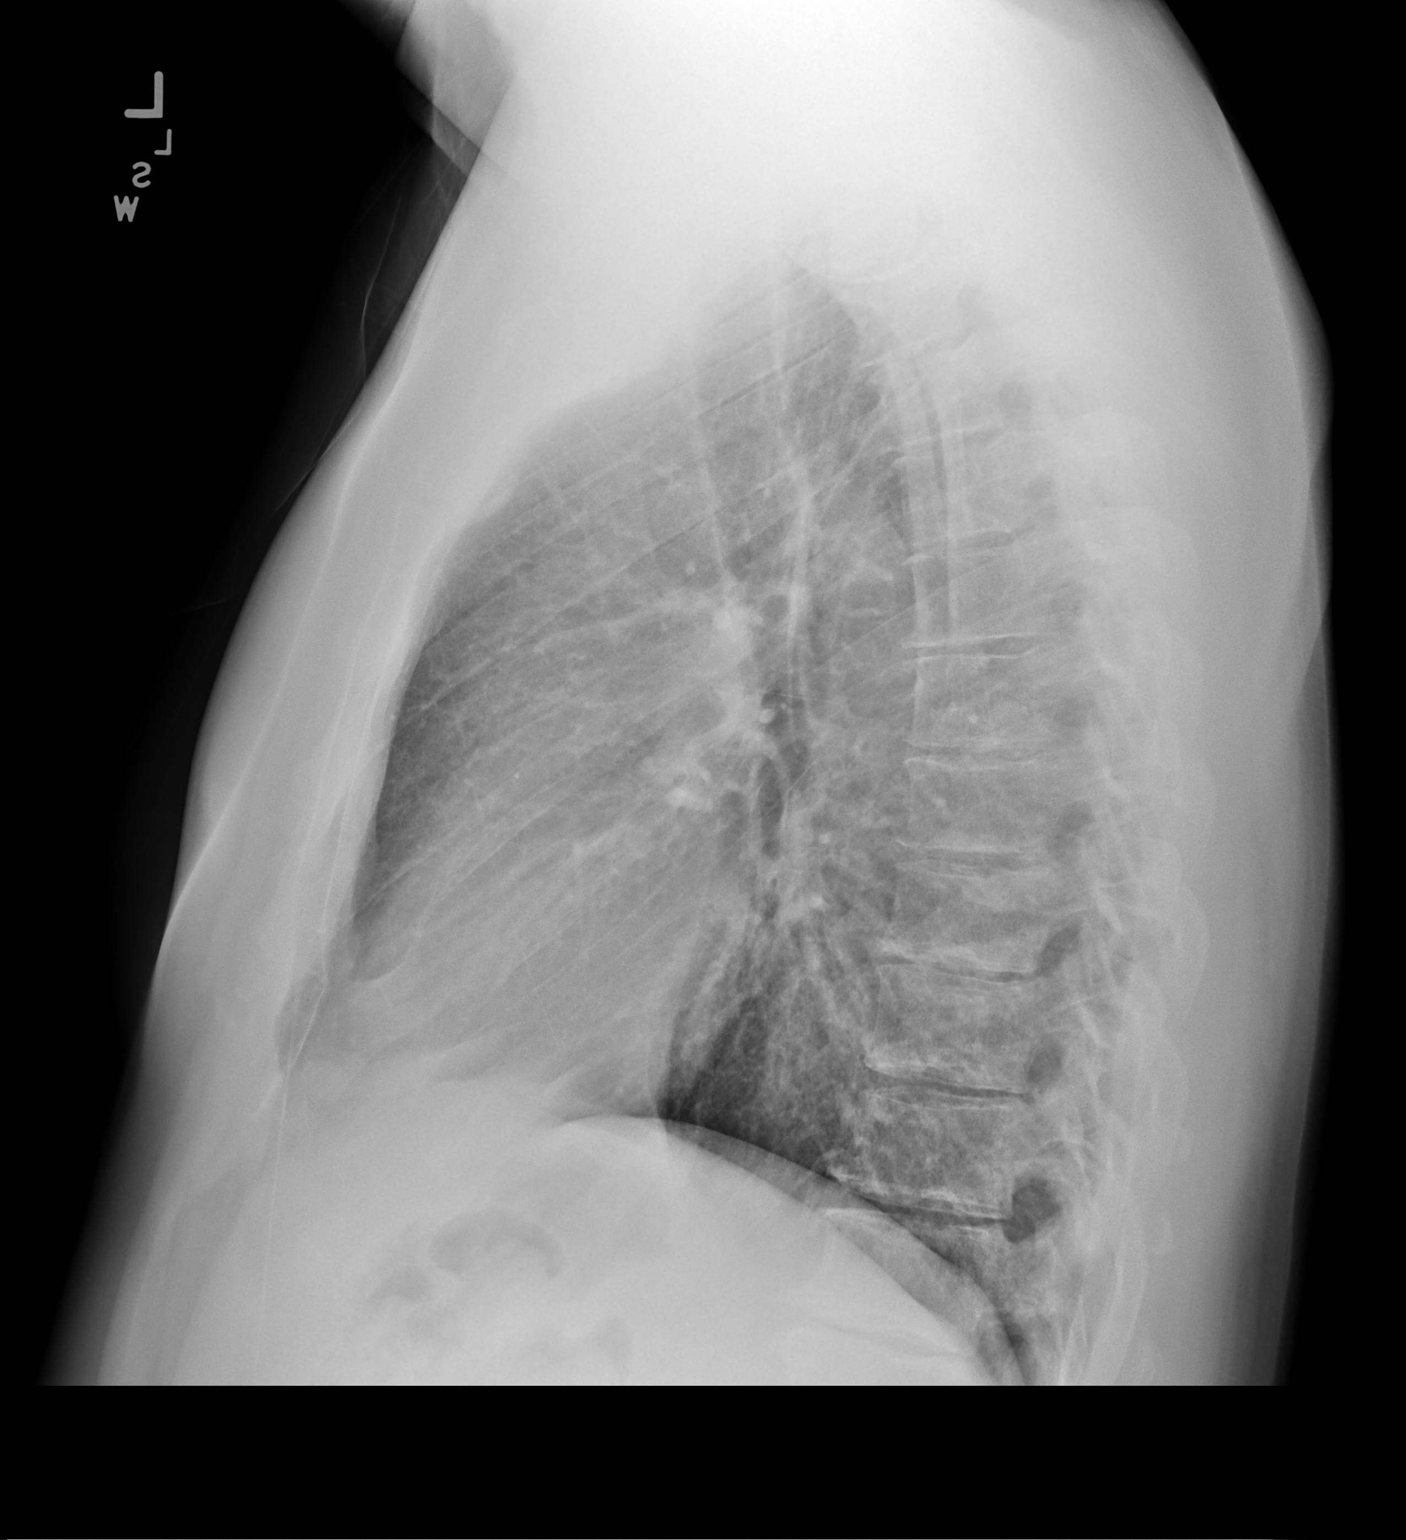

[2 of 2 positions shown; findings below may reference images not displayed]

FINDINGS: The heart size and mediastinal contours are within normal limits.
Both lungs are clear. Tiny calcified granuloma noted in left upper
lobe. The visualized skeletal structures are unremarkable.
IMPRESSION: No active cardiopulmonary disease.

## 2019-07-14 DIAGNOSIS — F411 Generalized anxiety disorder: Secondary | ICD-10-CM | POA: Diagnosis not present

## 2019-07-14 DIAGNOSIS — F330 Major depressive disorder, recurrent, mild: Secondary | ICD-10-CM | POA: Diagnosis not present

## 2019-11-09 DIAGNOSIS — F5105 Insomnia due to other mental disorder: Secondary | ICD-10-CM | POA: Diagnosis not present

## 2019-11-09 DIAGNOSIS — F330 Major depressive disorder, recurrent, mild: Secondary | ICD-10-CM | POA: Diagnosis not present

## 2019-11-09 DIAGNOSIS — F411 Generalized anxiety disorder: Secondary | ICD-10-CM | POA: Diagnosis not present

## 2019-12-22 ENCOUNTER — Ambulatory Visit (INDEPENDENT_AMBULATORY_CARE_PROVIDER_SITE_OTHER): Payer: BC Managed Care – PPO

## 2019-12-22 ENCOUNTER — Other Ambulatory Visit: Payer: Self-pay

## 2019-12-22 DIAGNOSIS — Z23 Encounter for immunization: Secondary | ICD-10-CM | POA: Diagnosis not present

## 2020-01-10 DIAGNOSIS — F330 Major depressive disorder, recurrent, mild: Secondary | ICD-10-CM | POA: Diagnosis not present

## 2020-01-10 DIAGNOSIS — F411 Generalized anxiety disorder: Secondary | ICD-10-CM | POA: Diagnosis not present

## 2020-01-10 DIAGNOSIS — F5105 Insomnia due to other mental disorder: Secondary | ICD-10-CM | POA: Diagnosis not present

## 2020-01-30 ENCOUNTER — Other Ambulatory Visit: Payer: Self-pay | Admitting: Internal Medicine

## 2020-02-09 ENCOUNTER — Other Ambulatory Visit: Payer: Self-pay | Admitting: Internal Medicine

## 2020-02-20 DIAGNOSIS — I10 Essential (primary) hypertension: Secondary | ICD-10-CM

## 2020-02-20 DIAGNOSIS — Z0000 Encounter for general adult medical examination without abnormal findings: Secondary | ICD-10-CM

## 2020-02-20 DIAGNOSIS — R7303 Prediabetes: Secondary | ICD-10-CM

## 2020-02-20 DIAGNOSIS — Z125 Encounter for screening for malignant neoplasm of prostate: Secondary | ICD-10-CM

## 2020-02-28 ENCOUNTER — Other Ambulatory Visit: Payer: Self-pay

## 2020-02-28 ENCOUNTER — Other Ambulatory Visit (INDEPENDENT_AMBULATORY_CARE_PROVIDER_SITE_OTHER): Payer: BC Managed Care – PPO

## 2020-02-28 DIAGNOSIS — R7303 Prediabetes: Secondary | ICD-10-CM | POA: Diagnosis not present

## 2020-02-28 DIAGNOSIS — I10 Essential (primary) hypertension: Secondary | ICD-10-CM | POA: Diagnosis not present

## 2020-02-28 DIAGNOSIS — Z125 Encounter for screening for malignant neoplasm of prostate: Secondary | ICD-10-CM | POA: Diagnosis not present

## 2020-02-28 DIAGNOSIS — Z0000 Encounter for general adult medical examination without abnormal findings: Secondary | ICD-10-CM | POA: Diagnosis not present

## 2020-02-28 LAB — CBC WITH DIFFERENTIAL/PLATELET
Basophils Absolute: 0 10*3/uL (ref 0.0–0.1)
Basophils Relative: 0.6 % (ref 0.0–3.0)
Eosinophils Absolute: 0.1 10*3/uL (ref 0.0–0.7)
Eosinophils Relative: 2.7 % (ref 0.0–5.0)
HCT: 40.5 % (ref 39.0–52.0)
Hemoglobin: 13.7 g/dL (ref 13.0–17.0)
Lymphocytes Relative: 30.8 % (ref 12.0–46.0)
Lymphs Abs: 1.7 10*3/uL (ref 0.7–4.0)
MCHC: 33.8 g/dL (ref 30.0–36.0)
MCV: 86.6 fl (ref 78.0–100.0)
Monocytes Absolute: 0.6 10*3/uL (ref 0.1–1.0)
Monocytes Relative: 10.6 % (ref 3.0–12.0)
Neutro Abs: 3.1 10*3/uL (ref 1.4–7.7)
Neutrophils Relative %: 55.3 % (ref 43.0–77.0)
Platelets: 227 10*3/uL (ref 150.0–400.0)
RBC: 4.68 Mil/uL (ref 4.22–5.81)
RDW: 12.8 % (ref 11.5–15.5)
WBC: 5.6 10*3/uL (ref 4.0–10.5)

## 2020-02-28 LAB — COMPREHENSIVE METABOLIC PANEL
ALT: 36 U/L (ref 0–53)
AST: 24 U/L (ref 0–37)
Albumin: 4.2 g/dL (ref 3.5–5.2)
Alkaline Phosphatase: 35 U/L — ABNORMAL LOW (ref 39–117)
BUN: 15 mg/dL (ref 6–23)
CO2: 32 mEq/L (ref 19–32)
Calcium: 9.2 mg/dL (ref 8.4–10.5)
Chloride: 100 mEq/L (ref 96–112)
Creatinine, Ser: 1.02 mg/dL (ref 0.40–1.50)
GFR: 81.83 mL/min (ref >60.00)
Glucose, Bld: 91 mg/dL (ref 70–99)
Potassium: 4.1 mEq/L (ref 3.5–5.1)
Sodium: 139 mEq/L (ref 135–145)
Total Bilirubin: 0.4 mg/dL (ref 0.2–1.2)
Total Protein: 7.5 g/dL (ref 6.0–8.3)

## 2020-02-28 LAB — LIPID PANEL
Cholesterol: 193 mg/dL (ref 0–200)
HDL: 34.7 mg/dL — ABNORMAL LOW (ref >39.00)
LDL Cholesterol: 126 mg/dL — ABNORMAL HIGH (ref 0–99)
NonHDL: 158.55
Total CHOL/HDL Ratio: 6
Triglycerides: 161 mg/dL — ABNORMAL HIGH (ref 0.0–149.0)
VLDL: 32.2 mg/dL (ref 0.0–40.0)

## 2020-02-28 LAB — MICROALBUMIN / CREATININE URINE RATIO
Creatinine,U: 276.1 mg/dL
Microalb Creat Ratio: 0.5 mg/g (ref 0.0–30.0)
Microalb, Ur: 1.4 mg/dL (ref 0.0–1.9)

## 2020-02-28 LAB — HEMOGLOBIN A1C: Hgb A1c MFr Bld: 6.1 % (ref 4.6–6.5)

## 2020-02-28 LAB — PSA: PSA: 1.26 ng/mL (ref 0.10–4.00)

## 2020-02-28 LAB — TSH: TSH: 2.93 u[IU]/mL (ref 0.35–4.50)

## 2020-03-01 ENCOUNTER — Other Ambulatory Visit: Payer: Self-pay

## 2020-03-01 ENCOUNTER — Encounter: Payer: Self-pay | Admitting: Internal Medicine

## 2020-03-01 ENCOUNTER — Ambulatory Visit (INDEPENDENT_AMBULATORY_CARE_PROVIDER_SITE_OTHER): Payer: BC Managed Care – PPO | Admitting: Internal Medicine

## 2020-03-01 VITALS — BP 108/80 | HR 67 | Temp 98.1°F | Resp 15 | Ht 74.0 in | Wt 228.0 lb

## 2020-03-01 DIAGNOSIS — Z0000 Encounter for general adult medical examination without abnormal findings: Secondary | ICD-10-CM | POA: Diagnosis not present

## 2020-03-01 DIAGNOSIS — I10 Essential (primary) hypertension: Secondary | ICD-10-CM | POA: Diagnosis not present

## 2020-03-01 DIAGNOSIS — F418 Other specified anxiety disorders: Secondary | ICD-10-CM | POA: Diagnosis not present

## 2020-03-01 DIAGNOSIS — F5105 Insomnia due to other mental disorder: Secondary | ICD-10-CM | POA: Diagnosis not present

## 2020-03-01 DIAGNOSIS — Z23 Encounter for immunization: Secondary | ICD-10-CM

## 2020-03-01 DIAGNOSIS — R7303 Prediabetes: Secondary | ICD-10-CM

## 2020-03-01 DIAGNOSIS — F324 Major depressive disorder, single episode, in partial remission: Secondary | ICD-10-CM

## 2020-03-01 NOTE — Assessment & Plan Note (Signed)

## 2020-03-01 NOTE — Assessment & Plan Note (Signed)
Managed with zoloft,  Dose recently increased to help manage stress of daughter's decompensation

## 2020-03-01 NOTE — Progress Notes (Signed)
Patient ID: Kevin Barry, male    DOB: 19-Feb-1961  Age: 59 y.o. MRN: [id number]  The patient is here for annual wellness examination and management of other chronic and acute problems.  This visit occurred during the SARS-CoV-2 public health emergency.  Safety protocols were in place, including screening questions prior to the visit, additional usage of staff PPE, and extensive cleaning of exam room while observing appropriate contact time as indicated for disinfecting solutions.     The risk factors are reflected in the social history.  The roster of all physicians providing medical care to patient - is listed in the Snapshot section of the chart.  Activities of daily living:  The patient is 100% independent in all ADLs: dressing, toileting, feeding as well as independent mobility  Home safety : The patient has smoke detectors in the home. They wear seatbelts.  There are no firearms at home. There is no violence in the home.   There is no risks for hepatitis, STDs or HIV. There is no   history of blood transfusion. They have no travel history to infectious disease endemic areas of the world.  The patient has seen their dentist in the last six month. They have seen their eye doctor in the last year. They admit to slight hearing difficulty with regard to whispered voices and some television programs.  They have deferred audiologic testing in the last year.  They do not  have excessive sun exposure. Discussed the need for sun protection: hats, long sleeves and use of sunscreen if there is significant sun exposure.   Diet: the importance of a healthy diet is discussed. They do have a healthy diet.  The benefits of regular aerobic exercise were discussed. She walks 4 times per week ,  20 minutes.   Depression screen: there are no signs or vegative symptoms of depression- irritability, change in appetite, anhedonia, sadness/tearfullness.  Cognitive assessment: the patient manages all their financial  and personal affairs and is actively engaged. They could relate day,date,year and events; recalled 2/3 objects at 3 minutes; performed clock-face test normally.  The following portions of the patient's history were reviewed and updated as appropriate: allergies, current medications, past family history, past medical history,  past surgical history, past social history  and problem list.  Visual acuity was not assessed per patient preference since she has regular follow up with her ophthalmologist. Hearing and body mass index were assessed and reviewed.   During the course of the visit the patient was educated and counseled about appropriate screening and preventive services including : fall prevention , diabetes screening, nutrition counseling, colorectal cancer screening, and recommended immunizations.    CC: The primary encounter diagnosis was Encounter for preventive health examination. Diagnoses of Need for shingles vaccine, Prediabetes, Insomnia secondary to depression with anxiety, Essential hypertension, and Major depressive disorder with single episode, in partial remission [facility]) were also pertinent to this visit.   1) GAD:  Aggravated by daughter Fleet Contras who has  Down's syndrome and recent recurrent episodes of psychiatric decompensation with 10% weight loss  requiring admission  To [facility] recently .  Patient's psychiatrist has increased his zoloft to 100 mg daily  And added Lunesta 1 mg for insomnia,  Using it prn  physically doing well .  Working from home x 2 years.   Weight stable    takign  2) HTN: taking 50 mg losartan and 3.125 coreg without side effegs,  Home redings   120-130 /70-80    History  Deuce has a past medical history of Allergy, Depression, Disorder of keratinization, History of chicken pox, and Hypertension.   He has a past surgical history that includes Tonsillectomy and adenoidectomy and Colonoscopy with propofol (N/A, 12/05/2014).   His family history includes Cancer in  his maternal grandfather, maternal grandmother, and mother; Crohn's disease in his brother; Down syndrome in his daughter; Early death (age of onset: 37) in his father; Heart attack in his father; Heart attack (age of onset: 50) in his mother; Heart disease in his maternal grandmother and paternal grandfather; Heart disease (age of onset: 5) in his father; Heart disease (age of onset: 36) in his mother; Stroke in his maternal grandmother and paternal grandfather.He reports that he has never smoked. He has never used smokeless tobacco. He reports current alcohol use. He reports that he does not use drugs.  Outpatient Medications Prior to Visit  Medication Sig Dispense Refill  . acetaminophen (TYLENOL) 500 MG tablet Take 1,000 mg by mouth every 6 (six) hours as needed.    Marland Kitchen albuterol (VENTOLIN HFA) 108 (90 Base) MCG/ACT inhaler Inhale 2 puffs into the lungs every 6 (six) hours as needed for wheezing or shortness of breath. 8 g 2  . carvedilol (COREG) 3.125 MG tablet TAKE 1 TABLET TWICE A DAY WITH MEALS 180 tablet 3  . eszopiclone (LUNESTA) 1 MG TABS tablet Take 1 mg by mouth at bedtime.    Marland Kitchen losartan (COZAAR) 50 MG tablet TAKE 1 TABLET DAILY 90 tablet 3  . sertraline (ZOLOFT) 100 MG tablet Take 100 mg by mouth daily.    . sertraline (ZOLOFT) 50 MG tablet TAKE 1 TABLET BY MOUTH ONCE DAILY WITH BREAKFAST (Patient not taking: Reported on 03/01/2020)  0   No facility-administered medications prior to visit.    Review of Systems   Patient denies headache, fevers, malaise, unintentional weight loss, skin rash, eye pain, sinus congestion and sinus pain, sore throat, dysphagia,  hemoptysis , cough, dyspnea, wheezing, chest pain, palpitations, orthopnea, edema, abdominal pain, nausea, melena, diarrhea, constipation, flank pain, dysuria, hematuria, urinary  Frequency, nocturia, numbness, tingling, seizures,  Focal weakness, Loss of consciousness,  Tremor, insomnia, depression, anxiety, and suicidal ideation.      Objective:  BP 108/80 (BP Location: Left Arm, Patient Position: Sitting, Cuff Size: Normal)   Pulse 67   Temp 98.1 F (36.7 C) (Oral)   Resp 15   Ht 6\' 2"  (1.88 m)   Wt 228 lb (103.4 kg)   SpO2 98%   BMI 29.27 kg/m   Physical Exam  General appearance: alert, cooperative and appears stated age Ears: normal TM's and external ear canals both ears Throat: lips, mucosa, and tongue normal; teeth and gums normal Neck: no adenopathy, no carotid bruit, supple, symmetrical, trachea midline and thyroid not enlarged, symmetric, no tenderness/mass/nodules Back: symmetric, no curvature. ROM normal. No CVA tenderness. Lungs: clear to auscultation bilaterally Heart: regular rate and rhythm, S1, S2 normal, no murmur, click, rub or gallop Abdomen: soft, non-tender; bowel sounds normal; no masses,  no organomegaly Pulses: 2+ and symmetric Skin: Skin color, texture, turgor normal. No rashes or lesions Lymph nodes: Cervical, supraclavicular, and axillary nodes normal.  Assessment & Plan:   Problem List Items Addressed This Visit      Unprioritized   Prediabetes    He is aware of the  benefits of a  low glycemic index diet and is encouarged to resume regular particpation in an aerobic  exercise activities.  Patient was advised to return for  repeat an A1c in 6 months.       Major depressive disorder, single episode    Managed with zoloft,  Dose recently increased to help manage stress of daughter's decompensation       Relevant Medications   sertraline (ZOLOFT) 100 MG tablet   Insomnia secondary to depression with anxiety    Now managed with Lunesta 1 mg by Dr Maryruth Bun .  encouraged to practice mindfulness exercises and resume daily exercise as well       Relevant Medications   sertraline (ZOLOFT) 100 MG tablet   Essential hypertension    Well controlled on current regimen or losartan 5 mg daily and carvedilol 3.125 mg bid . Renal function is normal and stable, no changes today.       Encounter for preventive health examination - Primary    age appropriate education and counseling updated, referrals for preventative services and immunizations addressed, dietary and smoking counseling addressed, most recent labs reviewed.  I have personally reviewed and have noted:  1) the patient's medical and social history 2) The pt's use of alcohol, tobacco, and illicit drugs 3) The patient's current medications and supplements 4) Functional ability including ADL's, fall risk, home safety risk, hearing and visual impairment 5) Diet and physical activities 6) Evidence for depression or mood disorder 7) The patient's height, weight, and BMI have been recorded in the chart  I have made referrals, and provided counseling and education based on review of the above       Other Visit Diagnoses    Need for shingles vaccine       Relevant Orders   Varicella-zoster vaccine IM (Shingrix) (Completed)      I am having Marjo Bicker maintain his acetaminophen, albuterol, carvedilol, losartan, sertraline, and eszopiclone.  No orders of the defined types were placed in this encounter.   Medications Discontinued During This Encounter  Medication Reason  . sertraline (ZOLOFT) 50 MG tablet Change in therapy    Follow-up: Return in about 6 months (around 08/30/2020).   Sherlene Shams, MD

## 2020-03-01 NOTE — Assessment & Plan Note (Addendum)
Now managed with Lunesta 1 mg by Dr Maryruth Bun .  encouraged to practice mindfulness exercises and resume daily exercise as well

## 2020-03-01 NOTE — Patient Instructions (Signed)
Your cholesterol is fine.  No medications are recommended until your 10 yr risk for events is > 15%  (currently it is 7.5%)   You received the shingles vaccine today;  2nd  Dose is due after Feb 3    Health Maintenance, Male Adopting a healthy lifestyle and getting preventive care are important in promoting health and wellness. Ask your health care provider about:  The right schedule for you to have regular tests and exams.  Things you can do on your own to prevent diseases and keep yourself healthy. What should I know about diet, weight, and exercise? Eat a healthy diet   Eat a diet that includes plenty of vegetables, fruits, low-fat dairy products, and lean protein.  Do not eat a lot of foods that are high in solid fats, added sugars, or sodium. Maintain a healthy weight Body mass index (BMI) is a measurement that can be used to identify possible weight problems. It estimates body fat based on height and weight. Your health care provider can help determine your BMI and help you achieve or maintain a healthy weight. Get regular exercise Get regular exercise. This is one of the most important things you can do for your health. Most adults should:  Exercise for at least 150 minutes each week. The exercise should increase your heart rate and make you sweat (moderate-intensity exercise).  Do strengthening exercises at least twice a week. This is in addition to the moderate-intensity exercise.  Spend less time sitting. Even light physical activity can be beneficial. Watch cholesterol and blood lipids Have your blood tested for lipids and cholesterol at 57 years of age, then have this test every 5 years. You may need to have your cholesterol levels checked more often if:  Your lipid or cholesterol levels are high.  You are older than 57 years of age.  You are at high risk for heart disease. What should I know about cancer screening? Many types of cancers can be detected early and may  often be prevented. Depending on your health history and family history, you may need to have cancer screening at various ages. This may include screening for:  Colorectal cancer.  Prostate cancer.  Skin cancer.  Lung cancer. What should I know about heart disease, diabetes, and high blood pressure? Blood pressure and heart disease  High blood pressure causes heart disease and increases the risk of stroke. This is more likely to develop in people who have high blood pressure readings, are of African descent, or are overweight.  Talk with your health care provider about your target blood pressure readings.  Have your blood pressure checked: ? Every 3-5 years if you are 57-70 years of age. ? Every year if you are 57 years old or older.  If you are between the ages of 55 and 34 and are a current or former smoker, ask your health care provider if you should have a one-time screening for abdominal aortic aneurysm (AAA). Diabetes Have regular diabetes screenings. This checks your fasting blood sugar level. Have the screening done:  Once every three years after age 4 if you are at a normal weight and have a low risk for diabetes.  More often and at a younger age if you are overweight or have a high risk for diabetes. What should I know about preventing infection? Hepatitis B If you have a higher risk for hepatitis B, you should be screened for this virus. Talk with your health care provider to find  out if you are at risk for hepatitis B infection. Hepatitis C Blood testing is recommended for:  Everyone born from [birth year] through 1963.  Anyone with known risk factors for hepatitis C. Sexually transmitted infections (STIs)  You should be screened each year for STIs, including gonorrhea and chlamydia, if: ? You are sexually active and are younger than 57 years of age. ? You are older than 57 years of age and your health care provider tells you that you are at risk for this type of  infection. ? Your sexual activity has changed since you were last screened, and you are at increased risk for chlamydia or gonorrhea. Ask your health care provider if you are at risk.  Ask your health care provider about whether you are at high risk for HIV. Your health care provider may recommend a prescription medicine to help prevent HIV infection. If you choose to take medicine to prevent HIV, you should first get tested for HIV. You should then be tested every 3 months for as long as you are taking the medicine. Follow these instructions at home: Lifestyle  Do not use any products that contain nicotine or tobacco, such as cigarettes, e-cigarettes, and chewing tobacco. If you need help quitting, ask your health care provider.  Do not use street drugs.  Do not share needles.  Ask your health care provider for help if you need support or information about quitting drugs. Alcohol use  Do not drink alcohol if your health care provider tells you not to drink.  If you drink alcohol: ? Limit how much you have to 0-2 drinks a day. ? Be aware of how much alcohol is in your drink. In the [place] one drink equals one 12 oz bottle of beer (355 mL), one 5 oz glass of wine (148 mL), or one 1 oz glass of hard liquor (44 mL). General instructions  Schedule regular health, dental, and eye exams.  Stay current with your vaccines.  Tell your health care provider if: ? You often feel depressed. ? You have ever been abused or do not feel safe at home. Summary  Adopting a healthy lifestyle and getting preventive care are important in promoting health and wellness.  Follow your health care provider's instructions about healthy diet, exercising, and getting tested or screened for diseases.  Follow your health care provider's instructions on monitoring your cholesterol and blood pressure. This information is not intended to replace advice given to you by your health care provider. Make sure you  discuss any questions you have with your health care provider. Document Revised: 03/09/2018 Document Reviewed: 03/09/2018 Elsevier Patient Education  2020 [employer].

## 2020-03-01 NOTE — Assessment & Plan Note (Signed)
He is aware of the  benefits of a  low glycemic index diet and is encouarged to resume regular particpation in an aerobic  exercise activities.  Patient was advised to return for repeat an A1c in 6 months.

## 2020-03-01 NOTE — Assessment & Plan Note (Signed)
Well controlled on current regimen or losartan 5 mg daily and carvedilol 3.125 mg bid . Renal function is normal and stable, no changes today.

## 2020-04-10 DIAGNOSIS — F411 Generalized anxiety disorder: Secondary | ICD-10-CM | POA: Diagnosis not present

## 2020-04-10 DIAGNOSIS — F5105 Insomnia due to other mental disorder: Secondary | ICD-10-CM | POA: Diagnosis not present

## 2020-04-10 DIAGNOSIS — F330 Major depressive disorder, recurrent, mild: Secondary | ICD-10-CM | POA: Diagnosis not present

## 2020-05-24 ENCOUNTER — Ambulatory Visit (INDEPENDENT_AMBULATORY_CARE_PROVIDER_SITE_OTHER): Payer: BC Managed Care – PPO

## 2020-05-24 ENCOUNTER — Other Ambulatory Visit: Payer: Self-pay

## 2020-05-24 DIAGNOSIS — Z23 Encounter for immunization: Secondary | ICD-10-CM

## 2020-05-24 NOTE — Progress Notes (Signed)
Patient presented for Shingrix injection to right deltoid, patient voiced no concerns nor showed any signs of distress during injection. 

## 2020-07-09 DIAGNOSIS — F411 Generalized anxiety disorder: Secondary | ICD-10-CM | POA: Diagnosis not present

## 2020-07-09 DIAGNOSIS — F5105 Insomnia due to other mental disorder: Secondary | ICD-10-CM | POA: Diagnosis not present

## 2020-07-09 DIAGNOSIS — F330 Major depressive disorder, recurrent, mild: Secondary | ICD-10-CM | POA: Diagnosis not present

## 2020-08-14 ENCOUNTER — Telehealth: Payer: Self-pay | Admitting: Internal Medicine

## 2020-08-14 DIAGNOSIS — U071 COVID-19: Secondary | ICD-10-CM | POA: Diagnosis not present

## 2020-08-14 DIAGNOSIS — Z20822 Contact with and (suspected) exposure to covid-19: Secondary | ICD-10-CM | POA: Diagnosis not present

## 2020-08-14 NOTE — Telephone Encounter (Signed)
Patient did a at home covid test and tested positive. Patient states his symptoms are mild, no fever, cough, body aches. He would like to know what he should take.

## 2020-08-14 NOTE — Telephone Encounter (Signed)
mychart message has been sent to inform patient. 

## 2020-08-29 ENCOUNTER — Telehealth (INDEPENDENT_AMBULATORY_CARE_PROVIDER_SITE_OTHER): Payer: BC Managed Care – PPO | Admitting: Adult Health

## 2020-08-29 ENCOUNTER — Encounter: Payer: Self-pay | Admitting: Adult Health

## 2020-08-29 ENCOUNTER — Telehealth: Payer: Self-pay | Admitting: Internal Medicine

## 2020-08-29 VITALS — BP 122/86 | Wt 223.0 lb

## 2020-08-29 DIAGNOSIS — J069 Acute upper respiratory infection, unspecified: Secondary | ICD-10-CM

## 2020-08-29 DIAGNOSIS — J40 Bronchitis, not specified as acute or chronic: Secondary | ICD-10-CM | POA: Insufficient documentation

## 2020-08-29 DIAGNOSIS — R059 Cough, unspecified: Secondary | ICD-10-CM | POA: Diagnosis not present

## 2020-08-29 DIAGNOSIS — B9689 Other specified bacterial agents as the cause of diseases classified elsewhere: Secondary | ICD-10-CM

## 2020-08-29 DIAGNOSIS — J029 Acute pharyngitis, unspecified: Secondary | ICD-10-CM

## 2020-08-29 MED ORDER — HYDROCOD POLST-CPM POLST ER 10-8 MG/5ML PO SUER: 5.0000 mL | Freq: Every evening | ORAL | 0 refills | Status: DC | PRN

## 2020-08-29 MED ORDER — CHERATUSSIN AC 100-10 MG/5ML PO SOLN: 5.0000 mL | Freq: Two times a day (BID) | ORAL | 0 refills | Status: DC | PRN

## 2020-08-29 MED ORDER — ALBUTEROL SULFATE HFA 108 (90 BASE) MCG/ACT IN AERS: 2.0000 | INHALATION_SPRAY | Freq: Four times a day (QID) | RESPIRATORY_TRACT | 0 refills | Status: AC | PRN

## 2020-08-29 MED ORDER — AZITHROMYCIN 250 MG PO TABS: ORAL_TABLET | ORAL | 0 refills | Status: DC

## 2020-08-29 MED ORDER — PREDNISONE 10 MG (21) PO TBPK: ORAL_TABLET | ORAL | 0 refills | Status: DC

## 2020-08-29 NOTE — Telephone Encounter (Signed)
[facility] called in stated that they do not have chlorpheniramine-HYDROcodone (TUSSIONEX PENNKINETIC ER) 10-8 MG/5ML SUER  you would have to call it in to another pharmacy

## 2020-08-29 NOTE — Patient Instructions (Addendum)
Meds ordered this encounter  Medications  . azithromycin (ZITHROMAX) 250 MG tablet    Sig: Take 2 tablets on day 1, then take 1 tablet on days 2-5    Dispense:  6 tablet    Refill:  0  . predniSONE (STERAPRED UNI-PAK 21 TAB) 10 MG (21) TBPK tablet    Sig: PO: Take 6 tablets on day 1:Take 5 tablets day 2:Take 4 tablets day 3: Take 3 tablets day 4:Take 2 tablets day five: 5 Take 1 tablet day 6    Dispense:  21 tablet    Refill:  0  .     . albuterol (VENTOLIN HFA) 108 (90 Base) MCG/ACT inhaler    Sig: Inhale 2 puffs into the lungs every 6 (six) hours as needed for wheezing or shortness of breath.    Dispense:  18 g    Refill:  0  . guaiFENesin-codeine (CHERATUSSIN AC) 100-10 MG/5ML syrup    Sig: Take 5 mLs by mouth 2 (two) times daily as needed for cough.    Dispense:  120 mL    Refill:  0  Cheratussin is in stock at your pharmacy will cause drowsiness take only as needed and take caution.    Acute Bronchitis, Adult  Acute bronchitis is when air tubes in the lungs (bronchi) suddenly get swollen. The condition can make it hard for you to breathe. In adults, acute bronchitis usually goes away within 2 weeks. A cough caused by bronchitis may last up to 3 weeks. Smoking, allergies, and asthma can make the condition worse. What are the causes? This condition is caused by:  Cold and flu viruses. The most common cause of this condition is the virus that causes the common cold.  Bacteria.  Substances that irritate the lungs, including: ? Smoke from cigarettes and other types of tobacco. ? Dust and pollen. ? Fumes from chemicals, gases, or burned fuel. ? Other materials that pollute indoor or outdoor air.  Close contact with someone who has acute bronchitis. What increases the risk? The following factors may make you more likely to develop this condition:  A weak body's defense system. This is also called the immune system.  Any condition that affects your lungs and breathing,  such as asthma. What are the signs or symptoms? Symptoms of this condition include:  A cough.  Coughing up clear, yellow, or green mucus.  Wheezing.  Chest congestion.  Shortness of breath.  A fever.  Body aches.  Chills.  A sore throat. How is this treated? Acute bronchitis may go away over time without treatment. Your doctor may recommend:  Drinking more fluids.  Taking a medicine for a fever or cough.  Using a device that gets medicine into your lungs (inhaler).  Using a vaporizer or a humidifier. These are machines that add water or moisture in the air to help with coughing and poor breathing. Follow these instructions at home: Activity  Get a lot of rest.  Avoid places where there are fumes from chemicals.  Return to your normal activities as told by your doctor. Ask your doctor what activities are safe for you. Lifestyle  Drink enough fluids to keep your pee (urine) pale yellow.  Do not drink alcohol.  Do not use any products that contain nicotine or tobacco, such as cigarettes, e-cigarettes, and chewing tobacco. If you need help quitting, ask your doctor. Be aware that: ? Your bronchitis will get worse if you smoke or breathe in other people's smoke (  secondhand smoke). ? Your lungs will heal faster if you quit smoking. General instructions  Take over-the-counter and prescription medicines only as told by your doctor.  Use an inhaler, cool mist vaporizer, or humidifier as told by your doctor.  Rinse your mouth often with salt water. To make salt water, dissolve -1 tsp (3-6 g) of salt in 1 cup (237 mL) of warm water.  Keep all follow-up visits as told by your doctor. This is important.   How is this prevented? To lower your risk of getting this condition again:  Wash your hands often with soap and water. If soap and water are not available, use hand sanitizer.  Avoid contact with people who have cold symptoms.  Try not to touch your mouth, nose,  or eyes with your hands.  Make sure to get the flu shot every year.   Contact a doctor if:  Your symptoms do not get better in 2 weeks.  You vomit more than once or twice.  You have symptoms of loss of fluid from your body (dehydration). These include: ? Dark urine. ? Dry skin or eyes. ? Increased thirst. ? Headaches. ? Confusion. ? Muscle cramps. Get help right away if:  You cough up blood.  You have chest pain.  You have very bad shortness of breath.  You become dehydrated.  You faint or keep feeling like you are going to faint.  You keep vomiting.  You have a very bad headache.  Your fever or chills get worse. These symptoms may be an emergency. Do not wait to see if the symptoms will go away. Get medical help right away. Call your local emergency services (911 in the [place] Do not drive yourself to the hospital. Summary  Acute bronchitis is when air tubes in the lungs (bronchi) suddenly get swollen. In adults, acute bronchitis usually goes away within 2 weeks.  Take over-the-counter and prescription medicines only as told by your doctor.  Drink enough fluid to keep your pee (urine) pale yellow.  Contact a doctor if your symptoms do not improve after 2 weeks of treatment.  Get help right away if you cough up blood, faint, or have chest pain or shortness of breath. This information is not intended to replace advice given to you by your health care provider. Make sure you discuss any questions you have with your health care provider. Document Revised: 10/07/2018 Document Reviewed: 10/07/2018 Elsevier Patient Education  2021 [employer] Codeine; Guaifenesin oral solution or syrup What is this medicine? CODEINE; GUAIFENESIN (KOE deen; gwye FEN e sin) is a cough suppressant and expectorant. It helps to stop or reduce coughing due to the common cold or inhaled irritants. It will not treat an infection. This medicine may be used for other purposes; ask your health  care provider or pharmacist if you have questions. COMMON BRAND NAME(S): Antituss AC, Brontex, Cheracol with Codeine, Cheratussin AC, Codefen, Dex-Tuss, Diabetic Tussin C, Duraganidin NR, ExeClear-C, Gani-Tuss NR, Guai Co, Guaiatussin AC, Guaifen C, Guiatuss AC, Halotussin AC, Iophen C-NR, M-Clear WC, Mar-Cof CG, Maxi-Tuss AC, Mytussin AC, Ninjacof-XG, RelCof C, Robafen AC, Romilar AC, Tussi-Organidin NR, Tussiden C, Tusso-C, Virtussin AC What should I tell my health care provider before I take this medicine? They need to know if you have any of these conditions:  Addison's disease  brain tumor  gallbladder disease  head injury  heart disease  history of a drug or alcohol abuse problem  history of irregular heartbeat  if you often drink  alcohol  kidney disease  liver disease  low blood pressure  lung or breathing disease, like asthma  mental illness  pancreatic disease  seizures  stomach or intestine problems  thyroid disease  trouble passing urine  an allergic or unusual reaction to guaifenesin, codeine, other medicines, foods, dyes, or preservatives  pregnant or trying to get pregnant  breast-feeding How should I use this medicine? Take this medicine by mouth. Follow the directions on the prescription label. You can take it with or without food. If it upsets your stomach, take it with food. Use a specially marked spoon or container to measure each dose. Ask your pharmacist if you do not have one. Household spoons are not accurate. Do not to overfill. Rinse the measuring device with water after each use. Take your medicine at regular intervals. Do not take it more often than directed. A special MedGuide will be given to you by the pharmacist with each prescription and refill. Be sure to read this information carefully each time. Talk to your pediatrician regarding the use of this medicine in children. This medicine is not approved for use in children. Overdosage: If  you think you have taken too much of this medicine contact a poison control center or emergency room at once. NOTE: This medicine is only for you. Do not share this medicine with others. What if I miss a dose? If you miss a dose, take it as soon as you can. If it is almost time for your next dose, take only that dose. Do not take double or extra doses. What may interact with this medicine? Do not take this medication with any of the following medicines:  alcohol  antihistamines for allergy, cough and cold  certain medicines for anxiety or sleep  certain medicines for depression like amitriptyline, fluoxetine, sertraline  certain medicines for seizures like carbamazepine, phenobarbital, phenytoin, primidone  general anesthetics like halothane, isoflurane, methoxyflurane, propofol  local anesthetics like lidocaine, pramoxine, tetracaine  MAOIs like Carbex, Eldepryl, Marplan, Nardil, and Parnate  medicines that relax muscles for surgery  other narcotic medicines (opiates) for pain or cough  phenothiazines like chlorpromazine, mesoridazine, prochlorperazine, thioridazine This medicine may also interact with the following medications:  antiviral medicines for HIV or AIDS  atropine  certain antibiotics like erythromycin and clarithromycin  certain medicines for bladder problems like oxybutynin, tolterodine  certain medicines for fungal infections like ketoconazole and itraconazole  certain medicines for irregular heart beat like amiodarone, propafenone, quinidine  certain medicines for Parkinson's disease like benztropine, trihexyphenidyl  certain medicines for stomach problems like dicyclomine, hyoscyamine  certain medicines for travel sickness like scopolamine  ipratropium  rifampin This list may not describe all possible interactions. Give your health care provider a list of all the medicines, herbs, non-prescription drugs, or dietary supplements you use. Also tell  them if you smoke, drink alcohol, or use illegal drugs. Some items may interact with your medicine. What should I watch for while using this medicine? Use exactly as directed by your doctor or health care professional. Do not take more than the recommended dose. You may develop tolerance to this medicine if you take it for a long time. Tolerance means that you will get less cough relief with time. Tell your doctor or health care professional if your symptoms do not improve or if they get worse. If you have been taking this medicine for a long time, do not suddenly stop taking it because you may develop a severe reaction. Your body becomes  used to the medicine. This does NOT mean you are addicted. Addiction is a behavior related to getting and using a drug for a nonmedical reason. If your doctor wants you to stop the medicine, the dose will be slowly lowered over time to avoid any side effects. There are different types of narcotic medicines (opiates). If you take more than one type at the same time or if you are taking another medicine that also causes drowsiness, you may have more side effects. Give your health care provider a list of all medicines you use. Your doctor will tell you how much medicine to take. Do not take more medicine than directed. Call emergency for help if you have problems breathing or unusual sleepiness. You may get drowsy or dizzy. Do not drive, use machinery, or do anything that needs mental alertness until you know how this medicine affects you. Do not stand or sit up quickly, especially if you are an older patient. This reduces the risk of dizzy or fainting spells. Alcohol may interfere with the effect of this medicine. Avoid alcoholic drinks. The medicine may cause constipation. Try to have a bowel movement at least every 2 to 3 days. If you do not have a bowel movement for 3 days, call your doctor or health care professional. Your mouth may get dry. Chewing sugarless gum or  sucking hard candy, and drinking plenty of water may help. Contact your doctor if the problem does not go away or is severe. What side effects may I notice from receiving this medicine? Side effects that you should report to your doctor or health care professional as soon as possible:  allergic reactions like skin rash, itching or hives, swelling of the face, lips, or tongue  breathing problems  confusion  seizures  signs and symptoms of low blood pressure like dizziness; feeling faint or lightheaded, falls; unusually weak or tired  trouble passing urine or change in the amount of urine Side effects that usually do not require medical attention (report to your doctor or health care professional if they continue or are bothersome):  constipation  dry mouth  nausea, vomiting  tiredness This list may not describe all possible side effects. Call your doctor for medical advice about side effects. You may report side effects to FDA at 1-800-FDA-1088. Where should I keep my medicine? Keep out of the reach of children. This medicine can be abused. Keep your medicine in a safe place to protect it from theft. Do not share this medicine with anyone. Selling or giving away this medicine is dangerous and against the law. Store at room temperature between 15 and 30 degrees C (59 and 86 degrees F). Protect from light. This medicine may cause accidental overdose and death if taken by other adults, children, or pets. Mix any unused medicine with a substance like cat littler or coffee grounds. Then throw the medicine away in a sealed container like a sealed bag or a coffee can with a lid. Do not use the medicine after the expiration date. NOTE: This sheet is a summary. It may not cover all possible information. If you have questions about this medicine, talk to your doctor, pharmacist, or health care provider.  2021 Elsevier/Gold Standard (2016-12-02 14:17:13) Cough, Adult A cough helps to clear your  throat and lungs. A cough may be a sign of an illness or another medical condition. An acute cough may only last 2-3 weeks, while a chronic cough may last 8 or more weeks. Many things  can cause a cough. They include:  Germs (viruses or bacteria) that attack the airway.  Breathing in things that bother (irritate) your lungs.  Allergies.  Asthma.  Mucus that runs down the back of your throat (postnasal drip).  Smoking.  Acid backing up from the stomach into the tube that moves food from the mouth to the stomach (gastroesophageal reflux).  Some medicines.  Lung problems.  Other medical conditions, such as heart failure or a blood clot in the lung (pulmonary embolism). Follow these instructions at home: Medicines  Take over-the-counter and prescription medicines only as told by your doctor.  Talk with your doctor before you take medicines that stop a cough (cough suppressants). Lifestyle  Do not smoke, and try not to be around smoke. Do not use any products that contain nicotine or tobacco, such as cigarettes, e-cigarettes, and chewing tobacco. If you need help quitting, ask your doctor.  Drink enough fluid to keep your pee (urine) pale yellow.  Avoid caffeine.  Do not drink alcohol if your doctor tells you not to drink.   General instructions  Watch for any changes in your cough. Tell your doctor about them.  Always cover your mouth when you cough.  Stay away from things that make you cough, such as perfume, candles, campfire smoke, or cleaning products.  If the air is dry, use a cool mist vaporizer or humidifier in your home.  If your cough is worse at night, try using extra pillows to raise your head up higher while you sleep.  Rest as needed.  Keep all follow-up visits as told by your doctor. This is important.   Contact a doctor if:  You have new symptoms.  You cough up pus.  Your cough does not get better after 2-3 weeks, or your cough gets worse.  Cough  medicine does not help your cough and you are not sleeping well.  You have pain that gets worse or pain that is not helped with medicine.  You have a fever.  You are losing weight and you do not know why.  You have night sweats. Get help right away if:  You cough up blood.  You have trouble breathing.  Your heartbeat is very fast. These symptoms may be an emergency. Do not wait to see if the symptoms will go away. Get medical help right away. Call your local emergency services (911 in the [place] Do not drive yourself to the hospital. Summary  A cough helps to clear your throat and lungs. Many things can cause a cough.  Take over-the-counter and prescription medicines only as told by your doctor.  Always cover your mouth when you cough.  Contact a doctor if you have new symptoms or you have a cough that does not get better or gets worse. This information is not intended to replace advice given to you by your health care provider. Make sure you discuss any questions you have with your health care provider. Document Revised: 05/05/2019 Document Reviewed: 04/04/2018 Elsevier Patient Education  2021 [employer] Inc. Prednisone tablets What is this medicine? PREDNISONE (PRED ni sone) is a corticosteroid. It is commonly used to treat inflammation of the skin, joints, lungs, and other organs. Common conditions treated include asthma, allergies, and arthritis. It is also used for other conditions, such as blood disorders and diseases of the adrenal glands. This medicine may be used for other purposes; ask your health care provider or pharmacist if you have questions. COMMON BRAND NAME(S): Deltasone, Predone, Sterapred,  Sterapred DS What should I tell my health care provider before I take this medicine? They need to know if you have any of these conditions:  Cushing's syndrome  diabetes  glaucoma  heart disease  high blood pressure  infection (especially a virus infection such as  chickenpox, cold sores, or herpes)  kidney disease  liver disease  mental illness  myasthenia gravis  osteoporosis  seizures  stomach or intestine problems  thyroid disease  an unusual or allergic reaction to lactose, prednisone, other medicines, foods, dyes, or preservatives  pregnant or trying to get pregnant  breast-feeding How should I use this medicine? Take this medicine by mouth with a glass of water. Follow the directions on the prescription label. Take this medicine with food. If you are taking this medicine once a day, take it in the morning. Do not take more medicine than you are told to take. Do not suddenly stop taking your medicine because you may develop a severe reaction. Your doctor will tell you how much medicine to take. If your doctor wants you to stop the medicine, the dose may be slowly lowered over time to avoid any side effects. Talk to your pediatrician regarding the use of this medicine in children. Special care may be needed. Overdosage: If you think you have taken too much of this medicine contact a poison control center or emergency room at once. NOTE: This medicine is only for you. Do not share this medicine with others. What if I miss a dose? If you miss a dose, take it as soon as you can. If it is almost time for your next dose, talk to your doctor or health care professional. You may need to miss a dose or take an extra dose. Do not take double or extra doses without advice. What may interact with this medicine? Do not take this medicine with any of the following medications:  metyrapone  mifepristone This medicine may also interact with the following medications:  aminoglutethimide  amphotericin B  aspirin and aspirin-like medicines  barbiturates  certain medicines for diabetes, like glipizide or glyburide  cholestyramine  cholinesterase inhibitors  cyclosporine  digoxin  diuretics  ephedrine  male hormones, like  estrogens and birth control pills  isoniazid  ketoconazole  NSAIDS, medicines for pain and inflammation, like ibuprofen or naproxen  phenytoin  rifampin  toxoids  vaccines  warfarin This list may not describe all possible interactions. Give your health care provider a list of all the medicines, herbs, non-prescription drugs, or dietary supplements you use. Also tell them if you smoke, drink alcohol, or use illegal drugs. Some items may interact with your medicine. What should I watch for while using this medicine? Visit your doctor or health care professional for regular checks on your progress. If you are taking this medicine over a prolonged period, carry an identification card with your name and address, the type and dose of your medicine, and your doctor's name and address. This medicine may increase your risk of getting an infection. Tell your doctor or health care professional if you are around anyone with measles or chickenpox, or if you develop sores or blisters that do not heal properly. If you are going to have surgery, tell your doctor or health care professional that you have taken this medicine within the last twelve months. Ask your doctor or health care professional about your diet. You may need to lower the amount of salt you eat. This medicine may increase blood sugar. Ask  your healthcare provider if changes in diet or medicines are needed if you have diabetes. What side effects may I notice from receiving this medicine? Side effects that you should report to your doctor or health care professional as soon as possible:  allergic reactions like skin rash, itching or hives, swelling of the face, lips, or tongue  changes in emotions or moods  changes in vision  depressed mood  eye pain  fever or chills, cough, sore throat, pain or difficulty passing urine  signs and symptoms of high blood sugar such as being more thirsty or hungry or having to urinate more than  normal. You may also feel very tired or have blurry vision.  swelling of ankles, feet Side effects that usually do not require medical attention (report to your doctor or health care professional if they continue or are bothersome):  confusion, excitement, restlessness  headache  nausea, vomiting  skin problems, acne, thin and shiny skin  trouble sleeping  weight gain This list may not describe all possible side effects. Call your doctor for medical advice about side effects. You may report side effects to FDA at 1-800-FDA-1088. Where should I keep my medicine? Keep out of the reach of children. Store at room temperature between 15 and 30 degrees C (59 and 86 degrees F). Protect from light. Keep container tightly closed. Throw away any unused medicine after the expiration date. NOTE: This sheet is a summary. It may not cover all possible information. If you have questions about this medicine, talk to your doctor, pharmacist, or health care provider.  2021 Elsevier/Gold Standard (2017-12-14 10:54:22) Azithromycin tablets What is this medicine? AZITHROMYCIN (az ith roe MYE sin) is a macrolide antibiotic. It is used to treat or prevent certain kinds of bacterial infections. It will not work for colds, flu, or other viral infections. This medicine may be used for other purposes; ask your health care provider or pharmacist if you have questions. COMMON BRAND NAME(S): Zithromax, Zithromax Tri-Pak, Zithromax Z-Pak What should I tell my health care provider before I take this medicine? They need to know if you have any of these conditions:  history of blood diseases, like leukemia  history of irregular heartbeat  kidney disease  liver disease  myasthenia gravis  an unusual or allergic reaction to azithromycin, erythromycin, other macrolide antibiotics, foods, dyes, or preservatives  pregnant or trying to get pregnant  breast-feeding How should I use this medicine? Take this  medicine by mouth with a full glass of water. Follow the directions on the prescription label. The tablets can be taken with food or on an empty stomach. If the medicine upsets your stomach, take it with food. Take your medicine at regular intervals. Do not take your medicine more often than directed. Take all of your medicine as directed even if you think your are better. Do not skip doses or stop your medicine early. Talk to your pediatrician regarding the use of this medicine in children. While this drug may be prescribed for children as young as 6 months for selected conditions, precautions do apply. Overdosage: If you think you have taken too much of this medicine contact a poison control center or emergency room at once. NOTE: This medicine is only for you. Do not share this medicine with others. What if I miss a dose? If you miss a dose, take it as soon as you can. If it is almost time for your next dose, take only that dose. Do not take double or  extra doses. What may interact with this medicine? Do not take this medicine with any of the following medications:  cisapride  dronedarone  pimozide  thioridazine This medicine may also interact with the following medications:  antacids that contain aluminum or magnesium  birth control pills  colchicine  cyclosporine  digoxin  ergot alkaloids like dihydroergotamine, ergotamine  nelfinavir  other medicines that prolong the QT interval (an abnormal heart rhythm)  phenytoin  warfarin This list may not describe all possible interactions. Give your health care provider a list of all the medicines, herbs, non-prescription drugs, or dietary supplements you use. Also tell them if you smoke, drink alcohol, or use illegal drugs. Some items may interact with your medicine. What should I watch for while using this medicine? Tell your doctor or healthcare provider if your symptoms do not start to get better or if they get worse. This  medicine may cause serious skin reactions. They can happen weeks to months after starting the medicine. Contact your healthcare provider right away if you notice fevers or flu-like symptoms with a rash. The rash may be red or purple and then turn into blisters or peeling of the skin. Or, you might notice a red rash with swelling of the face, lips or lymph nodes in your neck or under your arms. Do not treat diarrhea with over the counter products. Contact your doctor if you have diarrhea that lasts more than 2 days or if it is severe and watery. This medicine can make you more sensitive to the sun. Keep out of the sun. If you cannot avoid being in the sun, wear protective clothing and use sunscreen. Do not use sun lamps or tanning beds/booths. What side effects may I notice from receiving this medicine? Side effects that you should report to your doctor or health care professional as soon as possible:  allergic reactions like skin rash, itching or hives, swelling of the face, lips, or tongue  bloody or watery diarrhea  breathing problems  chest pain  fast, irregular heartbeat  muscle weakness  rash, fever, and swollen lymph nodes  redness, blistering, peeling, or loosening of the skin, including inside the mouth  signs and symptoms of liver injury like dark yellow or brown urine; general ill feeling or flu-like symptoms; light-colored stools; loss of appetite; nausea; right upper belly pain; unusually weak or tired; yellowing of the eyes or skin  white patches or sores in the mouth  unusually weak or tired Side effects that usually do not require medical attention (report to your doctor or health care professional if they continue or are bothersome):  diarrhea  nausea  stomach pain  vomiting This list may not describe all possible side effects. Call your doctor for medical advice about side effects. You may report side effects to FDA at 1-800-FDA-1088. Where should I keep my  medicine? Keep out of the reach of children. Store at room temperature between 15 and 30 degrees C (59 and 86 degrees F). Throw away any unused medicine after the expiration date. NOTE: This sheet is a summary. It may not cover all possible information. If you have questions about this medicine, talk to your doctor, pharmacist, or health care provider.  2021 Elsevier/Gold Standard (2018-06-23 17:19:20)

## 2020-08-29 NOTE — Telephone Encounter (Signed)
Patient schedule today for 4:30 virtual with Marvell Fuller, FNP.

## 2020-08-29 NOTE — Progress Notes (Signed)
Virtual Visit via Telephone Note  I connected with Kevin Barry on 08/29/20 at  4:30 PM EDT by telephone and verified that I am speaking with the correct person using two identifiers.  Parties involved in visit as below:   Location: Patient: at home  Provider: Provider: Provider's office at  [facility], [place].   I discussed the limitations, risks, security and privacy concerns of performing an evaluation and management service by telephone and the availability of in person appointments. I also discussed with the patient that there may be a patient responsible charge related to this service. The patient expressed understanding and agreed to proceed. Allergies  Allergen Reactions  . Lisinopril Cough  . Wellbutrin [Bupropion] Other (See Comments)    Increased aggressiveness and irritabiity  . Penicillins Rash    History of Present Illness: Patient is a 60 year old male in no acute distress who comes to office by telephone. He tested positive for Covid on 5/18 had reported mild symptoms on 08/14/2020 with no fever, cough and body aches reported at that time. He was in [place] May 14 th and many at his son's wedding tested positive.  Has remained afebrile.  He retested and was negative.   He reports he has been having some mild wheezing, no orthopnea. Cough is with talking and walking.   He has been using albuterol inhaler he had on hand, has tried Delsym, and took some old benzonatate and it helps some but does not relieve.  Denies any productive cough, irration.  Denies any shortness of breath, he does have wheezing that is intermittent and reported as mild.   Patient  denies any fever, body aches,chills, rash, chest pain, shortness of breath, nausea, vomiting, or diarrhea. Denies dizziness, lightheadedness, pre syncopal or syncopal episodes.  Denies any history of leg pain/ cramps/ warmth/ or swelling.   " I feel well other than this nagging  cough".  Observations/Objective:  Patient is alert and oriented and responsive to questions Engages in conversation with provider. Speaks in full sentences without any pauses without any shortness of breath or distress.  Assessment and Plan:  Bronchitis with acute wheezing - Plan: albuterol (VENTOLIN HFA) 108 (90 Base) MCG/ACT inhaler  Exudative pharyngitis  Bacterial upper respiratory infection - Plan: azithromycin (ZITHROMAX) 250 MG tablet, predniSONE (STERAPRED UNI-PAK 21 TAB) 10 MG (21) TBPK tablet  Cough - Plan: predniSONE (STERAPRED UNI-PAK 21 TAB) 10 MG (21) TBPK tablet, guaiFENesin-codeine (CHERATUSSIN AC) 100-10 MG/5ML syrup, Patient called back and [facility] on back order, called [facility] spoke with crystal pharm technician and it was canceled and new script sent in as above.   Suspect inflammatory response from post COVID cough and bronchial irritation , and bacterial infection given duration. Offered [facility] x ray and [facility], he declined and would rather try conservative management at home. Monitor blood pressure if using prescription cough syrup just as needed at hour of sleep. Advised to discontinue  Follow Up Instructions:    I discussed the assessment and treatment plan with the patient. The patient was provided an opportunity to ask questions and all were answered. The patient agreed with the plan and demonstrated an understanding of the instructions.   The patient was advised to call back or seek an in-person evaluation if the symptoms worsen or if the condition fails to improve as anticipated.  I provided 25 minutes of non-face-to-face time during this encounter.  Advised in person evaluation at  anytime is advised if any symptoms do not improve, worsen or change at any given time.  Red Flags discussed. The patient was given clear instructions to go to ER or return to medical center if any red flags develop, symptoms do not improve, worsen or new  problems develop. They verbalized understanding.  Return in about 4 days (around 09/02/2020), or if symptoms worsen or fail to improve, for at any time for any worsening symptoms, Go to Emergency room/ urgent care if worse. Jairo Ben, FNP

## 2020-08-30 NOTE — Telephone Encounter (Signed)
Spoke with pt and he stated that this was taken care of yesterday. He has picked up the medication.

## 2020-09-05 ENCOUNTER — Encounter: Payer: Self-pay | Admitting: Internal Medicine

## 2020-09-05 ENCOUNTER — Ambulatory Visit (INDEPENDENT_AMBULATORY_CARE_PROVIDER_SITE_OTHER): Payer: BC Managed Care – PPO

## 2020-09-05 ENCOUNTER — Other Ambulatory Visit: Payer: Self-pay

## 2020-09-05 ENCOUNTER — Ambulatory Visit (INDEPENDENT_AMBULATORY_CARE_PROVIDER_SITE_OTHER): Payer: BC Managed Care – PPO | Admitting: Internal Medicine

## 2020-09-05 VITALS — BP 118/76 | HR 74 | Temp 97.0°F | Resp 15 | Ht 74.0 in | Wt 228.8 lb

## 2020-09-05 DIAGNOSIS — J189 Pneumonia, unspecified organism: Secondary | ICD-10-CM | POA: Diagnosis not present

## 2020-09-05 DIAGNOSIS — U099 Post covid-19 condition, unspecified: Secondary | ICD-10-CM | POA: Diagnosis not present

## 2020-09-05 DIAGNOSIS — R053 Chronic cough: Secondary | ICD-10-CM | POA: Diagnosis not present

## 2020-09-05 DIAGNOSIS — Z8616 Personal history of COVID-19: Secondary | ICD-10-CM

## 2020-09-05 DIAGNOSIS — R059 Cough, unspecified: Secondary | ICD-10-CM | POA: Diagnosis not present

## 2020-09-05 MED ORDER — FLOVENT HFA 220 MCG/ACT IN AERO: 2.0000 | INHALATION_SPRAY | Freq: Two times a day (BID) | RESPIRATORY_TRACT | 12 refills | Status: AC

## 2020-09-05 MED ORDER — PREDNISONE 10 MG PO TABS: ORAL_TABLET | ORAL | 0 refills | Status: DC

## 2020-09-05 MED ORDER — BENZONATATE 200 MG PO CAPS: 200.0000 mg | ORAL_CAPSULE | Freq: Three times a day (TID) | ORAL | 1 refills | Status: DC | PRN

## 2020-09-05 NOTE — Patient Instructions (Signed)
I am repeating the prednisone taper and adding:  Steroid inhaler (fluticasone)  Tessalon perles for cough suppression   Chest x ray to document any scarring, infiltrates

## 2020-09-05 NOTE — Progress Notes (Signed)
Subjective:  Patient ID: Kevin Barry, male    DOB: 12-05-1960  Age: 60 y.o. 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 A diagnosis of History of COVID-19 was also pertinent to this visit.  HPI Kevin Barry presents fo FOLLOW UP ON PERSISTENT COUGH POST COVID MAY 18.  Kevin Barry is a 60 yr old male with no history of pulmonary disease who is following up for management of a cough that started during a mild COVID 19 infection in mid May .  He was treated via telemedicine in June 2 for cough  with a prednisone taper which he finished yesterday.  He has hd no history of fevers,  productive cough or shortness of breath     FINISHED PREDNISONE TAPER YESTERDAY.  COUGH DID NOT IMPROVE UNTIL TODAY .  FEELS BRONCHIAL IRRITATION.  NO NOCTURNAL COUGH.  AGGRAVATED BY ACTIVITY/EXERCISE.  DENIES PND AND PHLEGM     Outpatient Medications Prior to Visit  Medication Sig Dispense Refill   acetaminophen (TYLENOL) 500 MG tablet Take 1,000 mg by mouth every 6 (six) hours as needed.     albuterol (VENTOLIN HFA) 108 (90 Base) MCG/ACT inhaler Inhale 2 puffs into the lungs every 6 (six) hours as needed for wheezing or shortness of breath. 18 g 0   carvedilol (COREG) 3.125 MG tablet TAKE 1 TABLET TWICE A DAY WITH MEALS 180 tablet 3   eszopiclone (LUNESTA) 1 MG TABS tablet Take 1 mg by mouth at bedtime.     losartan (COZAAR) 50 MG tablet TAKE 1 TABLET DAILY 90 tablet 3   sertraline (ZOLOFT) 100 MG tablet Take 100 mg by mouth daily.     azithromycin (ZITHROMAX) 250 MG tablet Take 2 tablets on day 1, then take 1 tablet on days 2-5 (Patient not taking: Reported on 09/05/2020) 6 tablet 0   guaiFENesin-codeine (CHERATUSSIN AC) 100-10 MG/5ML syrup Take 5 mLs by mouth 2 (two) times daily as needed for cough. (Patient not taking: Reported on 09/05/2020) 120 mL 0   predniSONE (STERAPRED UNI-PAK 21 TAB) 10 MG (21) TBPK tablet PO: Take 6 tablets on day 1:Take 5 tablets day 2:Take 4 tablets day 3:  Take 3 tablets day 4:Take 2 tablets day five: 5 Take 1 tablet day 6 (Patient not taking: Reported on 09/05/2020) 21 tablet 0   No facility-administered medications prior to visit.    Review of Systems;  Patient denies headache, fevers, malaise, unintentional weight loss, skin rash, eye pain, sinus congestion and sinus pain, sore throat, dysphagia,  hemoptysis , cough, dyspnea, wheezing, chest pain, palpitations, orthopnea, edema, abdominal pain, nausea, melena, diarrhea, constipation, flank pain, dysuria, hematuria, urinary  Frequency, nocturia, numbness, tingling, seizures,  Focal weakness, Loss of consciousness,  Tremor, insomnia, depression, anxiety, and suicidal ideation.      Objective:  BP 118/76 (BP Location: Left Arm, Patient Position: Sitting, Cuff Size: Normal)   Pulse 74   Temp (!) 97 F (36.1 C) (Temporal)   Resp 15   Ht 6\' 2"  (1.88 m)   Wt 228 lb 12.8 oz (103.8 kg)   SpO2 98% Comment: Pt had Oxygen checked while walking. Oxygen dropped from 98% to 96%  BMI 29.38 kg/m   BP Readings from Last 3 Encounters:  09/05/20 118/76  08/29/20 122/86  03/01/20 108/80    Wt Readings from Last 3 Encounters:  09/05/20 228 lb 12.8 oz (103.8 kg)  08/29/20 223 lb (101.2 kg)  03/01/20 228 lb (103.4 kg)    General  appearance: alert, cooperative and appears stated age Ears: normal TM's and external ear canals both ears Throat: lips, mucosa, and tongue normal; teeth and gums normal Neck: no adenopathy, no carotid bruit, supple, symmetrical, trachea midline and thyroid not enlarged, symmetric, no tenderness/mass/nodules Back: symmetric, no curvature. ROM normal. No CVA tenderness. Lungs: clear to auscultation bilaterally Heart: regular rate and rhythm, S1, S2 normal, no murmur, click, rub or gallop Abdomen: soft, non-tender; bowel sounds normal; no masses,  no organomegaly Pulses: 2+ and symmetric Skin: Skin color, texture, turgor normal. No rashes or lesions Lymph nodes: Cervical,  supraclavicular, and axillary nodes normal.  Lab Results  Component Value Date   HGBA1C 6.1 02/28/2020   HGBA1C 6.2 01/03/2019    Lab Results  Component Value Date   CREATININE 1.02 02/28/2020   CREATININE 0.93 01/03/2019   CREATININE 0.95 06/25/2017    Lab Results  Component Value Date   WBC 5.6 02/28/2020   HGB 13.7 02/28/2020   HCT 40.5 02/28/2020   PLT 227.0 02/28/2020   GLUCOSE 91 02/28/2020   CHOL 193 02/28/2020   TRIG 161.0 (H) 02/28/2020   HDL 34.70 (L) 02/28/2020   LDLCALC 126 (H) 02/28/2020   ALT 36 02/28/2020   AST 24 02/28/2020   NA 139 02/28/2020   K 4.1 02/28/2020   CL 100 02/28/2020   CREATININE 1.02 02/28/2020   BUN 15 02/28/2020   CO2 32 02/28/2020   TSH 2.93 02/28/2020   PSA 1.26 02/28/2020   HGBA1C 6.1 02/28/2020   MICROALBUR 1.4 02/28/2020    CT CARDIAC SCORING  Addendum Date: 05/15/2016   ADDENDUM REPORT: 05/15/2016 10:28 CLINICAL DATA:  Risk stratification EXAM: Coronary Calcium Score TECHNIQUE: The patient was scanned on a Siemens Somatom 64 slice scanner. Axial non-contrast 3 mm slices were carried out through the heart. The data set was analyzed on a dedicated work station and scored using the Agatson method. FINDINGS: Non-cardiac: See separate report from [facility]. Ascending Aorta:  Normal size, no calcifications. Pericardium: Normal. Coronary arteries:  Normal origin. IMPRESSION: Coronary calcium score of 0. This was 0 percentile for age and sex matched control. Kevin Barry Electronically Signed   By: Kevin Barry   On: 05/15/2016 10:28   Result Date: 05/15/2016 EXAM: OVER-READ INTERPRETATION  CT CHEST The following report is an over-read performed by radiologist Dr. Noe Gens [facility] on 05/15/2016. This over-read does not include interpretation of cardiac or coronary anatomy or pathology. The coronary calcium score interpretation by the cardiologist is attached. COMPARISON:  None. FINDINGS: Cardiovascular:  Heart is normal size. Visualized aorta normal caliber. Mediastinum/Nodes: No adenopathy in the lower mediastinum or hila. Lungs/Pleura: Visualized lungs are clear.  No effusions. Upper Abdomen: Imaging into the upper abdomen shows no acute findings. Musculoskeletal: Chest wall soft tissues and bony structures unremarkable. IMPRESSION: No acute or significant extracardiac abnormality. Electronically Signed: By: Charlett Nose M.D. On: 05/15/2016 09:09    Assessment & Plan:   Problem List Items Addressed This Visit       Unprioritized   History of COVID-19    Infection occurred Mid May 2022.  He was not hospitalized and did not require treatment with  Antivirals. Recovery was complicated by bronchitis and cough has only improved the completion of a 6 day prednisone taper.  Chest   Xray was done today and was normal  .  Will repeat prednisone taper and add cough suppressant.        Other Visit Diagnoses  Post-COVID chronic cough    -  Primary   Relevant Orders   DG Chest 2 View (Completed)       I have discontinued Onalee Hua Rissler's azithromycin, predniSONE, and Cheratussin AC. I am also having him start on Flovent HFA, predniSONE, and benzonatate. Additionally, I am having him maintain his acetaminophen, carvedilol, losartan, sertraline, eszopiclone, and albuterol.  Meds ordered this encounter  Medications   fluticasone (FLOVENT HFA) 220 MCG/ACT inhaler    Sig: Inhale 2 puffs into the lungs in the morning and at bedtime.    Dispense:  1 each    Refill:  12   predniSONE (DELTASONE) 10 MG tablet    Sig: 6 tablets on Day 1 , then reduce by 1 tablet daily until gone    Dispense:  21 tablet    Refill:  0   benzonatate (TESSALON) 200 MG capsule    Sig: Take 1 capsule (200 mg total) by mouth 3 (three) times daily as needed for cough.    Dispense:  60 capsule    Refill:  1    Medications Discontinued During This Encounter  Medication Reason   azithromycin (ZITHROMAX) 250 MG tablet     guaiFENesin-codeine (CHERATUSSIN AC) 100-10 MG/5ML syrup    predniSONE (STERAPRED UNI-PAK 21 TAB) 10 MG (21) TBPK tablet     Follow-up: No follow-ups on file.   Sherlene Shams, MD

## 2020-09-06 ENCOUNTER — Ambulatory Visit: Payer: BC Managed Care – PPO | Admitting: Internal Medicine

## 2020-09-08 DIAGNOSIS — Z8616 Personal history of COVID-19: Secondary | ICD-10-CM | POA: Insufficient documentation

## 2020-09-08 NOTE — Assessment & Plan Note (Signed)
Infection occurred Mid May 2022.  He was not hospitalized and did not require treatment with  Antivirals. Recovery was complicated by bronchitis and cough has only improved the completion of a 6 day prednisone taper.  Chest   Xray was done today and was normal  .  Will repeat prednisone taper and add cough suppressant.

## 2020-09-10 MED ORDER — PREDNISONE 10 MG PO TABS: ORAL_TABLET | ORAL | 0 refills | Status: DC

## 2020-10-09 DIAGNOSIS — F411 Generalized anxiety disorder: Secondary | ICD-10-CM | POA: Diagnosis not present

## 2020-10-09 DIAGNOSIS — F5105 Insomnia due to other mental disorder: Secondary | ICD-10-CM | POA: Diagnosis not present

## 2020-10-09 DIAGNOSIS — F330 Major depressive disorder, recurrent, mild: Secondary | ICD-10-CM | POA: Diagnosis not present

## 2021-01-15 DIAGNOSIS — F5105 Insomnia due to other mental disorder: Secondary | ICD-10-CM | POA: Diagnosis not present

## 2021-01-15 DIAGNOSIS — F330 Major depressive disorder, recurrent, mild: Secondary | ICD-10-CM | POA: Diagnosis not present

## 2021-01-15 DIAGNOSIS — F411 Generalized anxiety disorder: Secondary | ICD-10-CM | POA: Diagnosis not present

## 2021-01-23 ENCOUNTER — Ambulatory Visit (INDEPENDENT_AMBULATORY_CARE_PROVIDER_SITE_OTHER): Payer: BC Managed Care – PPO

## 2021-01-23 ENCOUNTER — Other Ambulatory Visit: Payer: Self-pay

## 2021-01-23 DIAGNOSIS — Z23 Encounter for immunization: Secondary | ICD-10-CM | POA: Diagnosis not present

## 2021-01-24 ENCOUNTER — Other Ambulatory Visit: Payer: Self-pay | Admitting: Internal Medicine

## 2021-02-03 ENCOUNTER — Other Ambulatory Visit: Payer: Self-pay | Admitting: Internal Medicine

## 2021-04-09 ENCOUNTER — Ambulatory Visit (INDEPENDENT_AMBULATORY_CARE_PROVIDER_SITE_OTHER): Payer: BC Managed Care – PPO | Admitting: Internal Medicine

## 2021-04-09 ENCOUNTER — Other Ambulatory Visit: Payer: Self-pay

## 2021-04-09 ENCOUNTER — Encounter: Payer: Self-pay | Admitting: Internal Medicine

## 2021-04-09 VITALS — BP 118/78 | HR 83 | Temp 97.5°F | Ht 74.0 in | Wt 228.6 lb

## 2021-04-09 DIAGNOSIS — F324 Major depressive disorder, single episode, in partial remission: Secondary | ICD-10-CM

## 2021-04-09 DIAGNOSIS — Z0000 Encounter for general adult medical examination without abnormal findings: Secondary | ICD-10-CM | POA: Diagnosis not present

## 2021-04-09 DIAGNOSIS — Z125 Encounter for screening for malignant neoplasm of prostate: Secondary | ICD-10-CM | POA: Diagnosis not present

## 2021-04-09 DIAGNOSIS — I8392 Asymptomatic varicose veins of left lower extremity: Secondary | ICD-10-CM | POA: Insufficient documentation

## 2021-04-09 DIAGNOSIS — I10 Essential (primary) hypertension: Secondary | ICD-10-CM | POA: Diagnosis not present

## 2021-04-09 DIAGNOSIS — R7303 Prediabetes: Secondary | ICD-10-CM | POA: Diagnosis not present

## 2021-04-09 DIAGNOSIS — I83812 Varicose veins of left lower extremities with pain: Secondary | ICD-10-CM | POA: Diagnosis not present

## 2021-04-09 DIAGNOSIS — Z8672 Personal history of thrombophlebitis: Secondary | ICD-10-CM | POA: Diagnosis not present

## 2021-04-09 LAB — LIPID PANEL
Cholesterol: 203 mg/dL — ABNORMAL HIGH (ref 0–200)
HDL: 39.4 mg/dL (ref >39.00)
LDL Cholesterol: 134 mg/dL — ABNORMAL HIGH (ref 0–99)
NonHDL: 163.45
Total CHOL/HDL Ratio: 5
Triglycerides: 149 mg/dL (ref 0.0–149.0)
VLDL: 29.8 mg/dL (ref 0.0–40.0)

## 2021-04-09 LAB — PSA: PSA: 1.21 ng/mL (ref 0.10–4.00)

## 2021-04-09 LAB — COMPREHENSIVE METABOLIC PANEL
ALT: 36 U/L (ref 0–53)
AST: 24 U/L (ref 0–37)
Albumin: 4.5 g/dL (ref 3.5–5.2)
Alkaline Phosphatase: 40 U/L (ref 39–117)
BUN: 13 mg/dL (ref 6–23)
CO2: 31 mEq/L (ref 19–32)
Calcium: 9.4 mg/dL (ref 8.4–10.5)
Chloride: 100 mEq/L (ref 96–112)
Creatinine, Ser: 0.97 mg/dL (ref 0.40–1.50)
GFR: 86.24 mL/min (ref >60.00)
Glucose, Bld: 94 mg/dL (ref 70–99)
Potassium: 4.4 mEq/L (ref 3.5–5.1)
Sodium: 136 mEq/L (ref 135–145)
Total Bilirubin: 0.4 mg/dL (ref 0.2–1.2)
Total Protein: 8.1 g/dL (ref 6.0–8.3)

## 2021-04-09 LAB — CBC WITH DIFFERENTIAL/PLATELET
Basophils Absolute: 0 10*3/uL (ref 0.0–0.1)
Basophils Relative: 0.2 % (ref 0.0–3.0)
Eosinophils Absolute: 0.1 10*3/uL (ref 0.0–0.7)
Eosinophils Relative: 2.8 % (ref 0.0–5.0)
HCT: 42.5 % (ref 39.0–52.0)
Hemoglobin: 14.1 g/dL (ref 13.0–17.0)
Lymphocytes Relative: 29.3 % (ref 12.0–46.0)
Lymphs Abs: 1.5 10*3/uL (ref 0.7–4.0)
MCHC: 33.3 g/dL (ref 30.0–36.0)
MCV: 87.3 fl (ref 78.0–100.0)
Monocytes Absolute: 0.5 10*3/uL (ref 0.1–1.0)
Monocytes Relative: 10.3 % (ref 3.0–12.0)
Neutro Abs: 2.9 10*3/uL (ref 1.4–7.7)
Neutrophils Relative %: 57.4 % (ref 43.0–77.0)
Platelets: 272 10*3/uL (ref 150.0–400.0)
RBC: 4.87 Mil/uL (ref 4.22–5.81)
RDW: 12.7 % (ref 11.5–15.5)
WBC: 5 10*3/uL (ref 4.0–10.5)

## 2021-04-09 LAB — HEMOGLOBIN A1C: Hgb A1c MFr Bld: 6.3 % (ref 4.6–6.5)

## 2021-04-09 MED ORDER — CEPHALEXIN 500 MG PO CAPS: 500.0000 mg | ORAL_CAPSULE | Freq: Three times a day (TID) | ORAL | 0 refills | Status: DC

## 2021-04-09 NOTE — Assessment & Plan Note (Signed)
Left upper thigh. Remote history of vein stripping while residing in [place]   Recent episode of thrombophlebitis reported, now resolved. Painful on a daily basis,  Aggravated by sitting.  Refer to vascular surgery.  rx for cephalexi give for next episode of thrombophelbitis

## 2021-04-09 NOTE — Progress Notes (Signed)
Patient ID: Kevin Barry, male    DOB: [birth year]  Age: 61 y.o. MRN: [id number]  The patient is here for annual preventive examination and management of other chronic and acute problems.  This visit occurred during the SARS-CoV-2 public health emergency.  Safety protocols were in place, including screening questions prior to the visit, additional usage of staff PPE, and extensive cleaning of exam room while observing appropriate contact time as indicated for disinfecting solutions.    This visit occurred during the SARS-CoV-2 public health emergency.  Safety protocols were in place, including screening questions prior to the visit, additional usage of staff PPE, and extensive cleaning of exam room while observing appropriate contact time as indicated for disinfecting solutions.    The risk factors are reflected in the social history.  The roster of all physicians providing medical care to patient - is listed in the Snapshot section of the chart.  Activities of daily living:  The patient is 100% independent in all ADLs: dressing, toileting, feeding as well as independent mobility  Home safety : The patient has smoke detectors in the home. They wear seatbelts.  There are no firearms at home. There is no violence in the home.   There is no risks for hepatitis, STDs or HIV. There is no   history of blood transfusion. They have no travel history to infectious disease endemic areas of the world.  The patient has seen their dentist in the last six month. They have seen their eye doctor in the last year. They admit to slight hearing difficulty with regard to whispered voices and some television programs.  They have deferred audiologic testing in the last year.  They do not  have excessive sun exposure. Discussed the need for sun protection: hats, long sleeves and use of sunscreen if there is significant sun exposure.   Diet: the importance of a healthy diet is discussed. They do have a healthy diet.  The  benefits of regular aerobic exercise were discussed. She walks 4 times per week ,  20 minutes.   Depression screen: there are no signs or vegative symptoms of depression- irritability, change in appetite, anhedonia, sadness/tearfullness.  Cognitive assessment: the patient manages all their financial and personal affairs and is actively engaged. They could relate day,date,year and events; recalled 2/3 objects at 3 minutes; performed clock-face test normally.  The following portions of the patient's history were reviewed and updated as appropriate: allergies, current medications, past family history, past medical history,  past surgical history, past social history  and problem list.  Visual acuity was not assessed per patient preference since she has regular follow up with her ophthalmologist. Hearing and body mass index were assessed and reviewed.   During the course of the visit the patient was educated and counseled about appropriate screening and preventive services including : fall prevention , diabetes screening, nutrition counseling, colorectal cancer screening, and recommended immunizations.    CC: The primary encounter diagnosis was History of thrombophlebitis. Diagnoses of Varicose veins of left lower extremity with pain, Prostate cancer screening, Essential hypertension, Prediabetes, Major depressive disorder with single episode, in partial remission ([name]), and Encounter for preventive health examination were also pertinent to this visit.   1)  History of varicose veins and history of stripping  remotely presents with varicosity left anterior thigh aggravated by sitting down.  Relieved by standing  Symptoms have been intermittent for years. Aggravated by trip to [place] to visit mother  in [place] for surgery started approximately  2) HTN:  Hypertension: patient checks blood pressure twice weekly at home.  Readings have been for the most part <130/80 at rest . Patient is following a reduce  salt diet most days and is taking medications as prescribed    History Kevin Barry has a past medical history of Allergy, Depression, Disorder of keratinization, History of chicken pox, and Hypertension.   He has a past surgical history that includes Tonsillectomy and adenoidectomy and Colonoscopy with propofol (N/A, 12/05/2014).   His family history includes Cancer in his maternal grandfather, maternal grandmother, and mother; Crohn's disease in his brother; Down syndrome in his daughter; Early death (age of onset: 3) in his father; Heart attack in his father; Heart attack (age of onset: 35) in his mother; Heart disease in his maternal grandmother and paternal grandfather; Heart disease (age of onset: 34) in his father; Heart disease (age of onset: 31) in his mother; Stroke in his maternal grandmother and paternal grandfather.He reports that he has never smoked. He has never used smokeless tobacco. He reports current alcohol use. He reports that he does not use drugs.  Outpatient Medications Prior to Visit  Medication Sig Dispense Refill   acetaminophen (TYLENOL) 500 MG tablet Take 1,000 mg by mouth every 6 (six) hours as needed.     albuterol (VENTOLIN HFA) 108 (90 Base) MCG/ACT inhaler Inhale 2 puffs into the lungs every 6 (six) hours as needed for wheezing or shortness of breath. 18 g 0   benzonatate (TESSALON) 200 MG capsule Take 1 capsule (200 mg total) by mouth 3 (three) times daily as needed for cough. 60 capsule 1   carvedilol (COREG) 3.125 MG tablet TAKE 1 TABLET TWICE A DAY WITH MEALS 180 tablet 3   eszopiclone (LUNESTA) 1 MG TABS tablet Take 1 mg by mouth at bedtime.     fluticasone (FLOVENT HFA) 220 MCG/ACT inhaler Inhale 2 puffs into the lungs in the morning and at bedtime. 1 each 12   losartan (COZAAR) 50 MG tablet TAKE 1 TABLET DAILY 90 tablet 3   predniSONE (DELTASONE) 10 MG tablet 6 tablets on Day 1 , then reduce by 1 tablet daily until gone 21 tablet 0   sertraline (ZOLOFT) 100 MG  tablet Take 100 mg by mouth daily.     No facility-administered medications prior to visit.    Review of Systems  Patient denies headache, fevers, malaise, unintentional weight loss, skin rash, eye pain, sinus congestion and sinus pain, sore throat, dysphagia,  hemoptysis , cough, dyspnea, wheezing, chest pain, palpitations, orthopnea, edema, abdominal pain, nausea, melena, diarrhea, constipation, flank pain, dysuria, hematuria, urinary  Frequency, nocturia, numbness, tingling, seizures,  Focal weakness, Loss of consciousness,  Tremor, insomnia, depression, anxiety, and suicidal ideation.     Objective:  BP 118/78 (BP Location: Left Arm, Patient Position: Sitting, Cuff Size: Large)    Pulse 83    Temp (!) 97.5 F (36.4 C) (Oral)    Ht 6\' 2"  (1.88 m)    Wt 228 lb 9.6 oz (103.7 kg)    SpO2 98%    BMI 29.35 kg/m   Physical Exam  General appearance: alert, cooperative and appears stated age Ears: normal TM's and external ear canals both ears Throat: lips, mucosa, and tongue normal; teeth and gums normal Neck: no adenopathy, no carotid bruit, supple, symmetrical, trachea midline and thyroid not enlarged, symmetric, no tenderness/mass/nodules Back: symmetric, no curvature. ROM normal. No CVA tenderness. Lungs: clear to auscultation bilaterally Heart: regular rate and rhythm, S1, S2 normal, no  murmur, click, rub or gallop Abdomen: soft, non-tender; bowel sounds normal; no masses,  no organomegaly Pulses: 2+ and symmetric Ext:  superficial varicosity starting in left groin, without erythema.  Skin: Skin color, texture, turgor normal. No rashes or lesions Lymph nodes: Cervical, supraclavicular, and axillary nodes normal.  Assessment & Plan:   Problem List Items Addressed This Visit     Essential hypertension    Well controlled on current regimen. Renal function stable, no changes today.      Relevant Orders   Lipid panel   Encounter for preventive health examination    .Annual  comprehensive preventive exam was done as well as an evaluation and management of chronic conditions .  During the course of the visit the patient was educated and counseled about appropriate screening and preventive services including :  diabetes screening, lipid analysis with projected , , nutrition counseling, colorectal cancer screening, and recommended immunizations.  Printed recommendations for health maintenance screenings was given.       Major depressive disorder, single episode    Managed by Dr Nicolasa Ducking. .  Continue zoloft 100 mg daily       Prediabetes   Relevant Orders   Comprehensive metabolic panel   Hemoglobin A1c   Varicose veins of left lower extremity    Left upper thigh. Remote history of vein stripping while residing in [place]   Recent episode of thrombophlebitis reported, now resolved. Painful on a daily basis,  Aggravated by sitting.  Refer to vascular surgery.  rx for cephalexi give for next episode of thrombophelbitis       Relevant Orders   Ambulatory referral to Vascular Surgery   History of thrombophlebitis - Primary    Reported recently by patient,  With erythema to surrounding area and pain. resovled spontaneously  Cephalexin prescribed prn next episode       Relevant Orders   CBC with Differential/Platelet   D-Dimer, Quantitative   Ambulatory referral to Vascular Surgery   Other Visit Diagnoses     Prostate cancer screening       Relevant Orders   PSA       I am having Patrick North start on cephALEXin. I am also having him maintain his acetaminophen, sertraline, eszopiclone, albuterol, Flovent HFA, benzonatate, predniSONE, carvedilol, and losartan.  Meds ordered this encounter  Medications   cephALEXin (KEFLEX) 500 MG capsule    Sig: Take 1 capsule (500 mg total) by mouth 3 (three) times daily.    Dispense:  21 capsule    Refill:  0    HAS TAKEN KEFLEX BEFORE WITHOUT RASH    There are no discontinued medications.  Follow-up: No follow-ups on  file.   Crecencio Mc, MD

## 2021-04-09 NOTE — Patient Instructions (Signed)
I have prescribed cephalexin to use for your next episode of thrombophlebitis, along with advil  Referral to Vascular surgery in progress  You are up to date on all screenings and  vaccines.

## 2021-04-09 NOTE — Assessment & Plan Note (Signed)
Reported recently by patient,  With erythema to surrounding area and pain. resovled spontaneously  Cephalexin prescribed prn next episode

## 2021-04-09 NOTE — Assessment & Plan Note (Signed)
Managed by Dr Maryruth Bun. .  Continue zoloft 100 mg daily

## 2021-04-09 NOTE — Assessment & Plan Note (Signed)
.  Annual comprehensive preventive exam was done as well as an evaluation and management of chronic conditions .  During the course of the visit the patient was educated and counseled about appropriate screening and preventive services including :  diabetes screening, lipid analysis with projected , , nutrition counseling, colorectal cancer screening, and recommended immunizations.  Printed recommendations for health maintenance screenings was given.

## 2021-04-09 NOTE — Assessment & Plan Note (Signed)
Well controlled on current regimen. Renal function stable, no changes today. 

## 2021-04-11 LAB — D-DIMER, QUANTITATIVE: D-DIMER: <0.2 mg/L FEU (ref 0.00–0.49)

## 2021-04-12 ENCOUNTER — Encounter: Payer: Self-pay | Admitting: Internal Medicine

## 2021-04-14 ENCOUNTER — Other Ambulatory Visit: Payer: Self-pay | Admitting: Family

## 2021-04-14 DIAGNOSIS — E7800 Pure hypercholesterolemia, unspecified: Secondary | ICD-10-CM

## 2021-04-14 MED ORDER — SIMVASTATIN 20 MG PO TABS: 20.0000 mg | ORAL_TABLET | Freq: Every day | ORAL | 3 refills | Status: DC

## 2021-04-18 ENCOUNTER — Telehealth: Payer: BC Managed Care – PPO | Admitting: Physician Assistant

## 2021-04-18 DIAGNOSIS — H60392 Other infective otitis externa, left ear: Secondary | ICD-10-CM

## 2021-04-18 MED ORDER — NEOMYCIN-POLYMYXIN-HC 3.5-10000-1 OT SOLN: 3.0000 [drp] | Freq: Four times a day (QID) | OTIC | 0 refills | Status: DC

## 2021-04-18 NOTE — Progress Notes (Signed)

## 2021-05-07 DIAGNOSIS — F330 Major depressive disorder, recurrent, mild: Secondary | ICD-10-CM | POA: Diagnosis not present

## 2021-05-07 DIAGNOSIS — F411 Generalized anxiety disorder: Secondary | ICD-10-CM | POA: Diagnosis not present

## 2021-05-07 DIAGNOSIS — F5105 Insomnia due to other mental disorder: Secondary | ICD-10-CM | POA: Diagnosis not present

## 2021-07-16 ENCOUNTER — Encounter (INDEPENDENT_AMBULATORY_CARE_PROVIDER_SITE_OTHER): Payer: Self-pay

## 2021-07-16 ENCOUNTER — Encounter (INDEPENDENT_AMBULATORY_CARE_PROVIDER_SITE_OTHER): Payer: Self-pay | Admitting: Nurse Practitioner

## 2021-07-30 ENCOUNTER — Ambulatory Visit: Payer: BC Managed Care – PPO | Admitting: Internal Medicine

## 2021-07-31 ENCOUNTER — Other Ambulatory Visit (INDEPENDENT_AMBULATORY_CARE_PROVIDER_SITE_OTHER): Payer: Self-pay | Admitting: Nurse Practitioner

## 2021-07-31 DIAGNOSIS — I83812 Varicose veins of left lower extremities with pain: Secondary | ICD-10-CM

## 2021-08-01 ENCOUNTER — Encounter (INDEPENDENT_AMBULATORY_CARE_PROVIDER_SITE_OTHER): Payer: Self-pay | Admitting: Nurse Practitioner

## 2021-08-01 ENCOUNTER — Ambulatory Visit (INDEPENDENT_AMBULATORY_CARE_PROVIDER_SITE_OTHER): Payer: BC Managed Care – PPO

## 2021-08-01 ENCOUNTER — Ambulatory Visit (INDEPENDENT_AMBULATORY_CARE_PROVIDER_SITE_OTHER): Payer: BC Managed Care – PPO | Admitting: Nurse Practitioner

## 2021-08-01 DIAGNOSIS — I83812 Varicose veins of left lower extremities with pain: Secondary | ICD-10-CM

## 2021-08-15 ENCOUNTER — Ambulatory Visit: Payer: BC Managed Care – PPO | Admitting: Internal Medicine

## 2021-08-16 ENCOUNTER — Encounter (INDEPENDENT_AMBULATORY_CARE_PROVIDER_SITE_OTHER): Payer: Self-pay | Admitting: Nurse Practitioner

## 2021-08-16 NOTE — Progress Notes (Signed)
Subjective:    Patient ID: Kevin Barry, male    DOB: 1960-12-25, 61 y.o.   MRN: [id number] Chief Complaint  Patient presents with   Establish Care    Referred by Dr Darrick Huntsman    Mr. Winterton is a 61 year old male who presents today as a referral from Dr. Darrick Huntsman with pain and varicosities in his groin area.  The patient had previous treatment of his great saphenous vein ablation.  He recently had to take a trip to [place] for a family emergency and he noted that the varicosities became red and painful.  He was given antibiotics and this was helpful.  He notes that since that time these have become painful and situations where he has to stay seated for extended periods of time.  Today noninvasive studies noted reflux in the left lower extremity in the great saphenous vein at the saphenofemoral junction.  Previous endovenous ablation is still intact.  No evidence of DVT or superficial thrombophlebitis.   Review of Systems  Hematological:  Does not bruise/bleed easily.  All other systems reviewed and are negative.     Objective:   Physical Exam Vitals reviewed.  HENT:     Head: Normocephalic.  Cardiovascular:     Rate and Rhythm: Normal rate.     Pulses: Normal pulses.  Pulmonary:     Effort: Pulmonary effort is normal.  Musculoskeletal:        General: Tenderness present.  Skin:    General: Skin is warm and dry.  Neurological:     Mental Status: He is alert and oriented to person, place, and time.  Psychiatric:        Mood and Affect: Mood normal.        Behavior: Behavior normal.        Thought Content: Thought content normal.        Judgment: Judgment normal.    BP 113/73 (BP Location: Left Arm)   Pulse 92   Resp 17   Ht 6\' 2"  (1.88 m)   Wt 226 lb (102.5 kg)   BMI 29.02 kg/m   Past Medical History:  Diagnosis Date   Allergy    Depression    Disorder of keratinization    right eye.    History of chicken pox    Hypertension     Social History   Socioeconomic History    Marital status: Married    Spouse name: Not on file   Number of children: Not on file   Years of education: Not on file   Highest education level: Not on file  Occupational History   Not on file  Tobacco Use   Smoking status: Never   Smokeless tobacco: Never  Substance and Sexual Activity   Alcohol use: Yes    Comment: maybe 1 glass of wine once a month   Drug use: No   Sexual activity: Yes    Birth control/protection: None  Other Topics Concern   Not on file  Social History Narrative   Not on file   Social Determinants of Health   Financial Resource Strain: Not on file  Food Insecurity: Not on file  Transportation Needs: Not on file  Physical Activity: Not on file  Stress: Not on file  Social Connections: Not on file  Intimate Partner Violence: Not on file    Past Surgical History:  Procedure Laterality Date   COLONOSCOPY WITH PROPOFOL N/A 12/05/2014   Procedure: COLONOSCOPY WITH PROPOFOL;  Surgeon: 02/04/2015, MD;  Location: [facility] ENDOSCOPY;  Service: Endoscopy;  Laterality: N/A;   TONSILLECTOMY AND ADENOIDECTOMY      Family History  Problem Relation Age of Onset   Cancer Mother        Hodgkin disease   Heart disease Mother 3072       aortic valve replacement   Heart attack Mother 6774       has stent placed   Heart disease Father 7756       MI   Early death Father 9356       acute MI   Heart attack Father    Crohn's disease Brother    Heart disease Maternal Grandmother    Stroke Maternal Grandmother    Cancer Maternal Grandmother        unsure which cancer   Cancer Maternal Grandfather        pancreatic cancer?   Heart disease Paternal Grandfather    Stroke Paternal Grandfather    Down syndrome Daughter     Allergies  Allergen Reactions   Lisinopril Cough   Wellbutrin [Bupropion] Other (See Comments)    Increased aggressiveness and irritabiity   Penicillins Rash       Latest Ref Rng & Units 04/09/2021    9:05 AM 02/28/2020    8:01 AM  01/03/2019    8:53 AM  CBC  WBC 4.0 - 10.5 K/uL 5.0   5.6   5.3    Hemoglobin 13.0 - 17.0 g/dL 16.114.1   09.613.7   04.514.0    Hematocrit 39.0 - 52.0 % 42.5   40.5   42.7    Platelets 150.0 - 400.0 K/uL 272.0   227.0   264.0        CMP     Component Value Date/Time   NA 136 04/09/2021 0905   K 4.4 04/09/2021 0905   CL 100 04/09/2021 0905   CO2 31 04/09/2021 0905   GLUCOSE 94 04/09/2021 0905   BUN 13 04/09/2021 0905   CREATININE 0.97 04/09/2021 0905   CREATININE 1.00 07/06/2014 1607   CALCIUM 9.4 04/09/2021 0905   PROT 8.1 04/09/2021 0905   ALBUMIN 4.5 04/09/2021 0905   AST 24 04/09/2021 0905   ALT 36 04/09/2021 0905   ALKPHOS 40 04/09/2021 0905   BILITOT 0.4 04/09/2021 0905     No results found.     Assessment & Plan:   1. Varicose veins of left lower extremity with pain Recommend:  The patient has had successful ablation of the previously incompetent saphenous venous system but still has persistent symptoms of pain and swelling that are having a negative impact on daily life and daily activities.  Patient should undergo injection foam sclerotherapy to treat the residual varicosities.  The risks, benefits and alternative therapies were reviewed in detail with the patient.  All questions were answered.  The patient agrees to proceed with sclerotherapy at their convenience.  The patient will continue wearing the graduated compression stockings and using the over-the-counter pain medications to treat her symptoms.      - VAS US LOWER EXTREMITY VENOUS REFLUX   Current Outpatient Medications on File Prior to Visit  Medication Sig Dispense Refill   acetaminophen (TYLENOL) 500 MG tablet Take 1,000 mg by mouth every 6 (six) hours as needed.     albuterol (VENTOLIN HFA) 108 (90 Base) MCG/ACT inhaler Inhale 2 puffs into the lungs every 6 (six) hours as needed for wheezing or shortness of breath. 18 g 0   carvedilol (COREG) 3.125  MG tablet TAKE 1 TABLET TWICE A DAY WITH MEALS 180  tablet 3   eszopiclone (LUNESTA) 1 MG TABS tablet Take 1 mg by mouth at bedtime.     fluticasone (FLOVENT HFA) 220 MCG/ACT inhaler Inhale 2 puffs into the lungs in the morning and at bedtime. 1 each 12   losartan (COZAAR) 50 MG tablet TAKE 1 TABLET DAILY 90 tablet 3   sertraline (ZOLOFT) 100 MG tablet Take 100 mg by mouth daily.     simvastatin (ZOCOR) 20 MG tablet Take 1 tablet (20 mg total) by mouth at bedtime. 90 tablet 3   No current facility-administered medications on file prior to visit.    There are no Patient Instructions on file for this visit. No follow-ups on file.   Georgiana Spinner, NP

## 2021-09-02 DIAGNOSIS — F411 Generalized anxiety disorder: Secondary | ICD-10-CM | POA: Diagnosis not present

## 2021-09-02 DIAGNOSIS — F330 Major depressive disorder, recurrent, mild: Secondary | ICD-10-CM | POA: Diagnosis not present

## 2021-09-02 DIAGNOSIS — F5105 Insomnia due to other mental disorder: Secondary | ICD-10-CM | POA: Diagnosis not present

## 2021-12-23 ENCOUNTER — Other Ambulatory Visit: Payer: Self-pay | Admitting: Internal Medicine

## 2022-01-23 ENCOUNTER — Ambulatory Visit (INDEPENDENT_AMBULATORY_CARE_PROVIDER_SITE_OTHER): Payer: BC Managed Care – PPO

## 2022-01-23 DIAGNOSIS — Z23 Encounter for immunization: Secondary | ICD-10-CM | POA: Diagnosis not present

## 2022-01-26 ENCOUNTER — Encounter (INDEPENDENT_AMBULATORY_CARE_PROVIDER_SITE_OTHER): Payer: Self-pay

## 2022-02-14 ENCOUNTER — Other Ambulatory Visit: Payer: Self-pay | Admitting: Internal Medicine

## 2022-02-23 ENCOUNTER — Other Ambulatory Visit: Payer: Self-pay | Admitting: Internal Medicine

## 2022-03-26 ENCOUNTER — Other Ambulatory Visit: Payer: Self-pay

## 2022-03-26 MED ORDER — SIMVASTATIN 20 MG PO TABS: 20.0000 mg | ORAL_TABLET | Freq: Every day | ORAL | 3 refills | Status: DC

## 2022-04-06 IMAGING — DX DG CHEST 2V
2 series · 2 of 2 positions shown · non-contrast
Comparison: 10/11/2017

CLINICAL DATA: Post COVID pneumonia, persistent cough

EXAM:
CHEST - 2 VIEW

[chest pa]
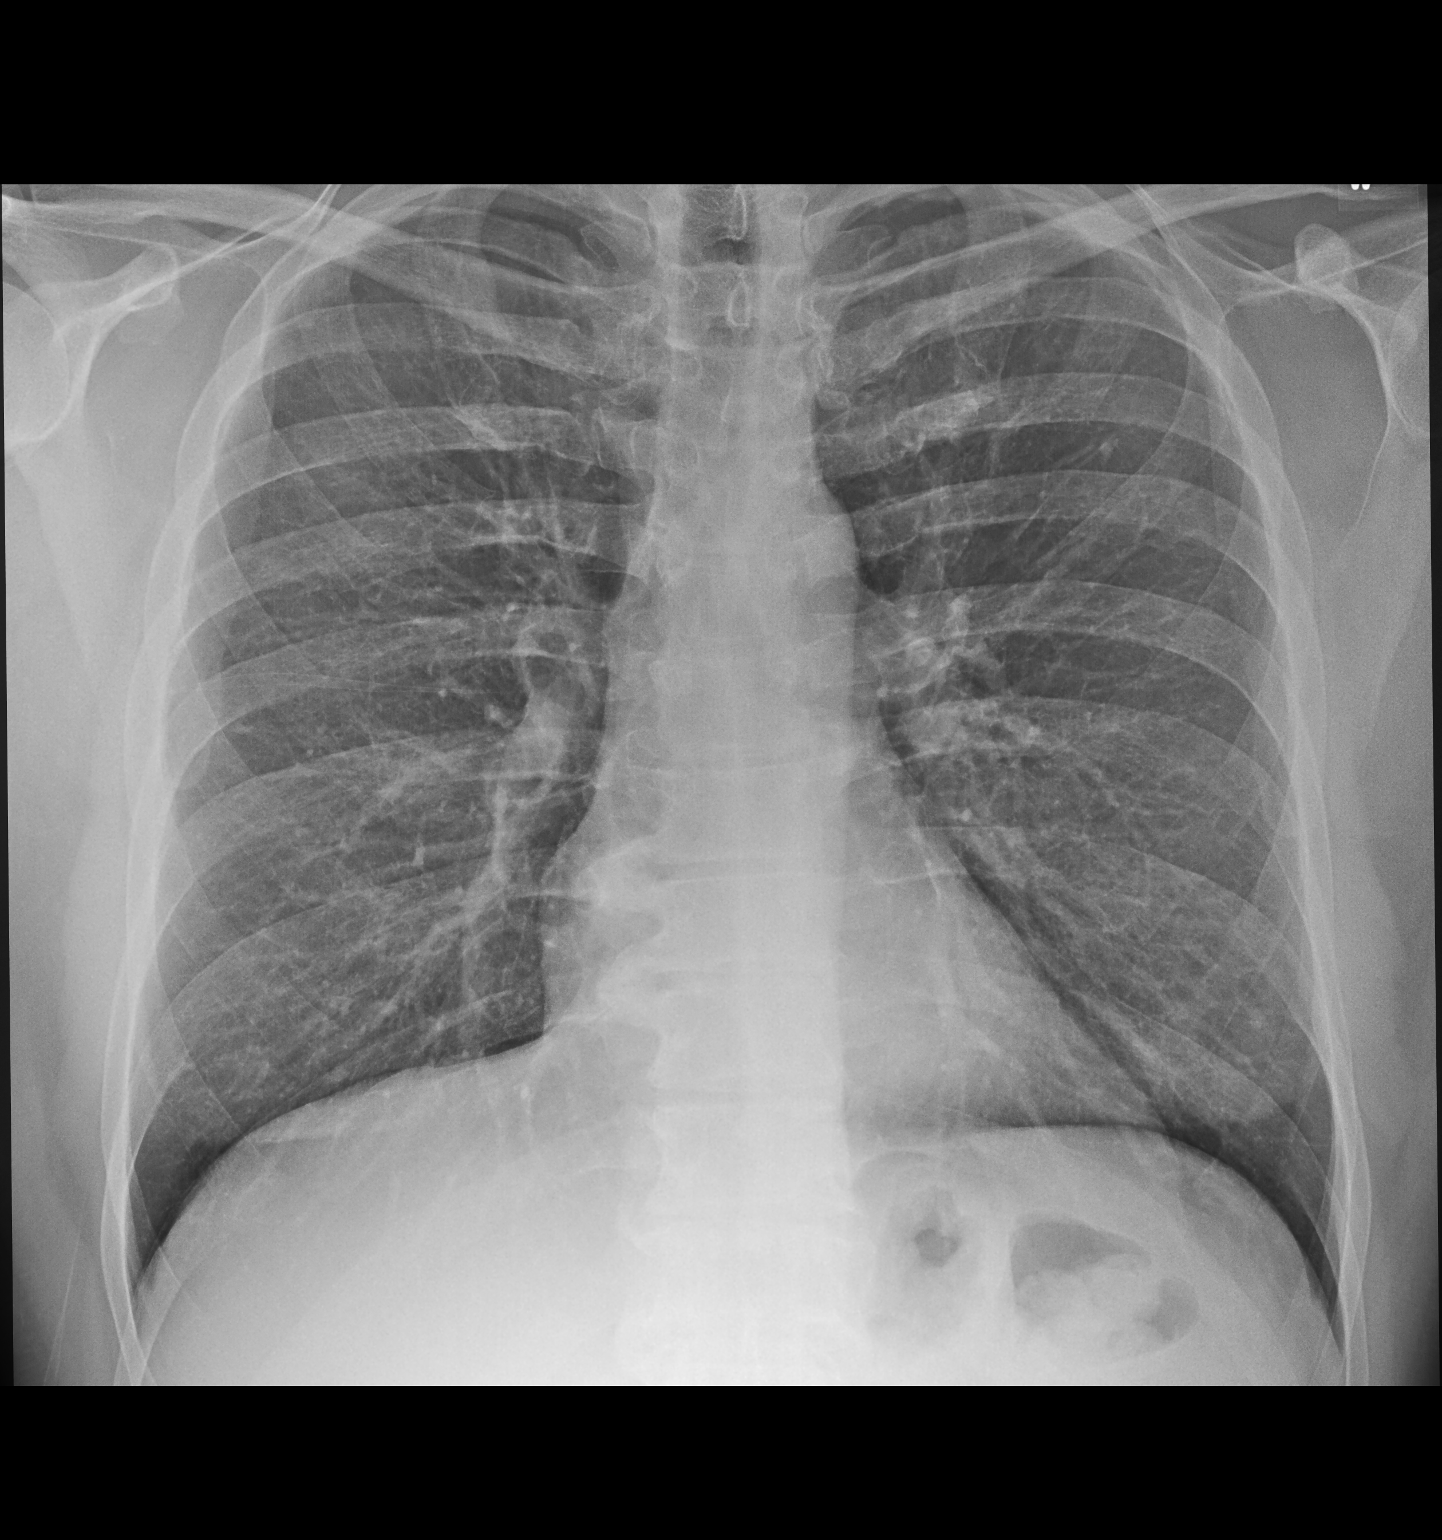

[chest lat]
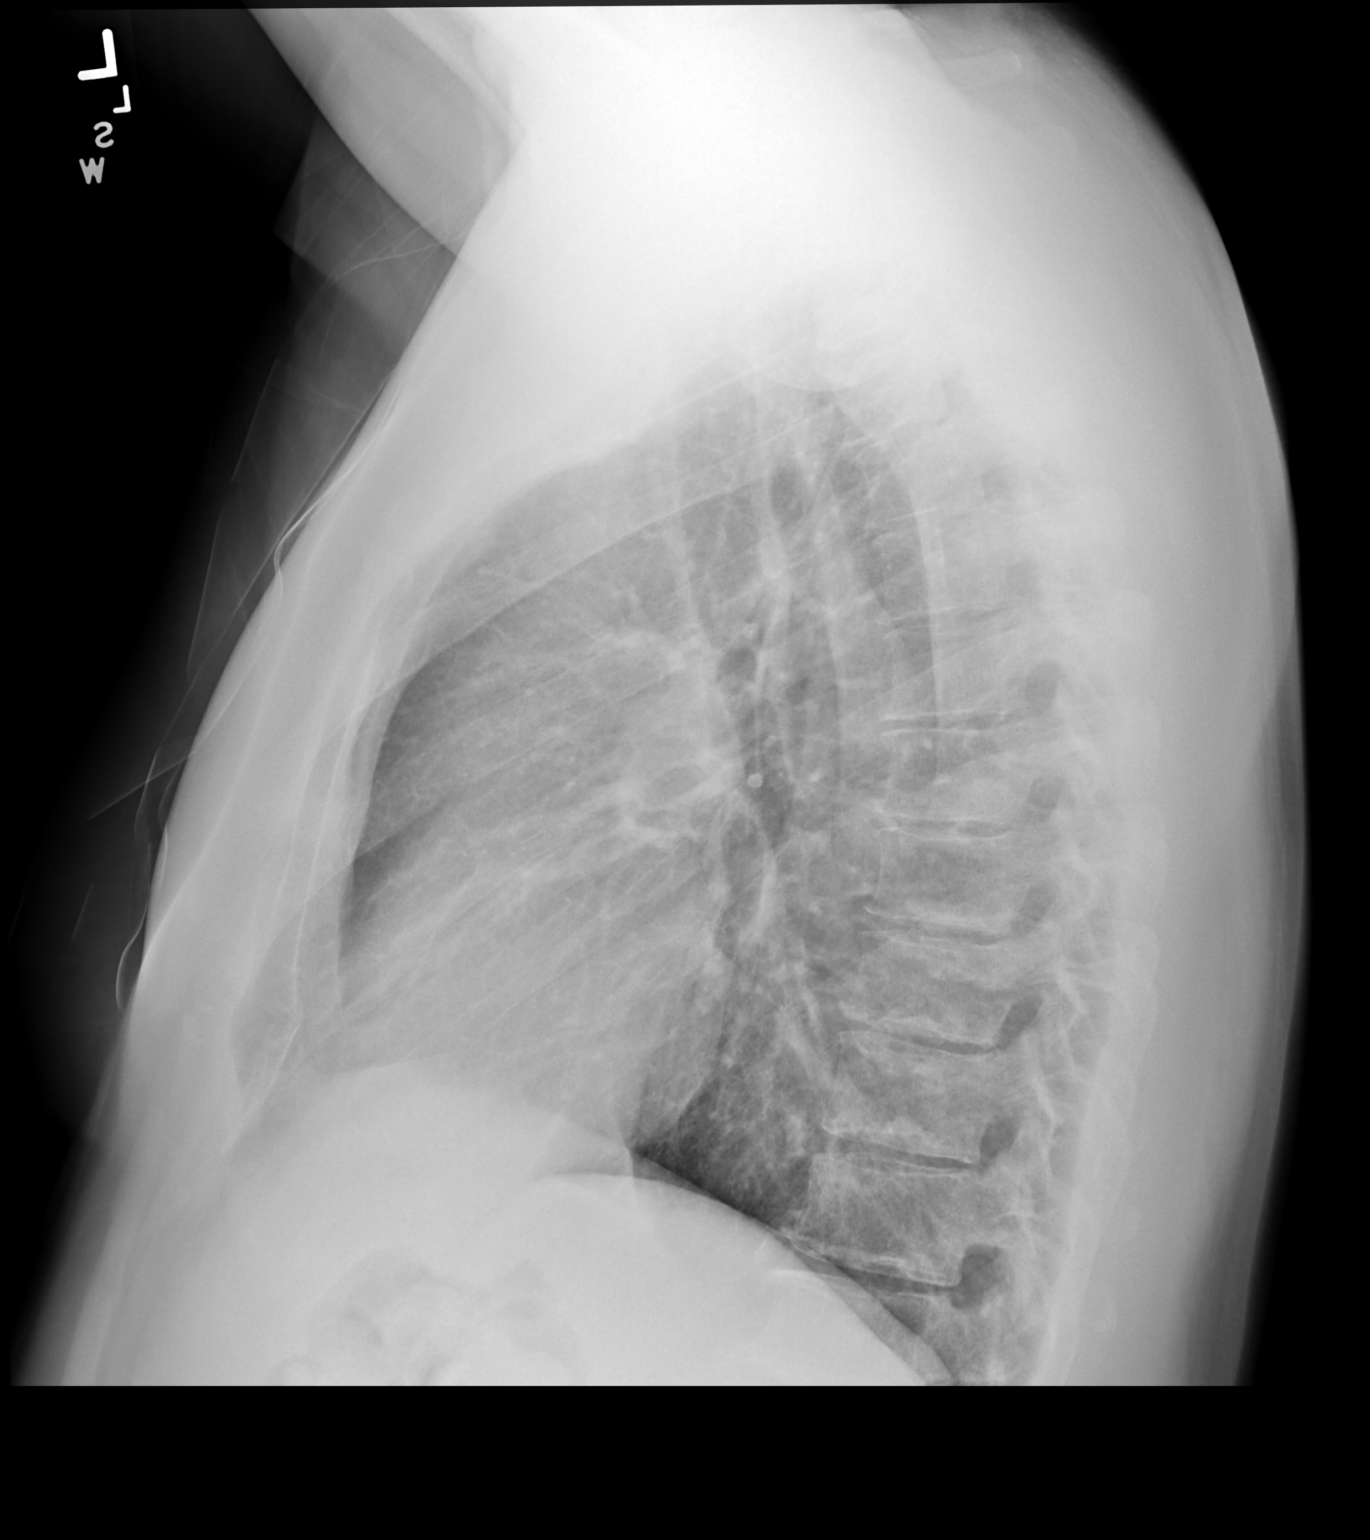

[2 of 2 positions shown; findings below may reference images not displayed]

FINDINGS: The heart size and mediastinal contours are within normal limits.
Both lungs are clear. The visualized skeletal structures are
unremarkable. Stable subcentimeter calcified granuloma in the left
upper lobe. Trachea midline. Degenerative changes of the spine
IMPRESSION: No active cardiopulmonary disease.

## 2022-04-08 ENCOUNTER — Encounter: Payer: BC Managed Care – PPO | Admitting: Internal Medicine

## 2022-04-15 ENCOUNTER — Other Ambulatory Visit: Payer: Self-pay | Admitting: Internal Medicine

## 2022-04-23 ENCOUNTER — Ambulatory Visit: Payer: BC Managed Care – PPO | Admitting: Internal Medicine

## 2022-04-30 DIAGNOSIS — F411 Generalized anxiety disorder: Secondary | ICD-10-CM | POA: Diagnosis not present

## 2022-04-30 DIAGNOSIS — F330 Major depressive disorder, recurrent, mild: Secondary | ICD-10-CM | POA: Diagnosis not present

## 2022-05-08 ENCOUNTER — Ambulatory Visit: Payer: BC Managed Care – PPO | Admitting: Internal Medicine

## 2022-06-14 ENCOUNTER — Other Ambulatory Visit: Payer: Self-pay | Admitting: Internal Medicine

## 2022-07-23 ENCOUNTER — Encounter: Payer: Self-pay | Admitting: Internal Medicine

## 2022-07-23 ENCOUNTER — Ambulatory Visit (INDEPENDENT_AMBULATORY_CARE_PROVIDER_SITE_OTHER): Payer: BC Managed Care – PPO | Admitting: Internal Medicine

## 2022-07-23 VITALS — BP 118/80 | HR 77 | Temp 97.8°F | Ht 74.0 in | Wt 229.4 lb

## 2022-07-23 DIAGNOSIS — E559 Vitamin D deficiency, unspecified: Secondary | ICD-10-CM | POA: Diagnosis not present

## 2022-07-23 DIAGNOSIS — R7303 Prediabetes: Secondary | ICD-10-CM

## 2022-07-23 DIAGNOSIS — Z79899 Other long term (current) drug therapy: Secondary | ICD-10-CM

## 2022-07-23 DIAGNOSIS — Z125 Encounter for screening for malignant neoplasm of prostate: Secondary | ICD-10-CM | POA: Diagnosis not present

## 2022-07-23 DIAGNOSIS — E7800 Pure hypercholesterolemia, unspecified: Secondary | ICD-10-CM

## 2022-07-23 DIAGNOSIS — F418 Other specified anxiety disorders: Secondary | ICD-10-CM

## 2022-07-23 DIAGNOSIS — I10 Essential (primary) hypertension: Secondary | ICD-10-CM | POA: Diagnosis not present

## 2022-07-23 DIAGNOSIS — Z0000 Encounter for general adult medical examination without abnormal findings: Secondary | ICD-10-CM | POA: Diagnosis not present

## 2022-07-23 DIAGNOSIS — F5105 Insomnia due to other mental disorder: Secondary | ICD-10-CM

## 2022-07-23 LAB — LIPID PANEL
Cholesterol: 153 mg/dL (ref 0–200)
HDL: 40.2 mg/dL (ref >39.00)
LDL Cholesterol: 82 mg/dL (ref 0–99)
NonHDL: 112.92
Total CHOL/HDL Ratio: 4
Triglycerides: 153 mg/dL — ABNORMAL HIGH (ref 0.0–149.0)
VLDL: 30.6 mg/dL (ref 0.0–40.0)

## 2022-07-23 LAB — CBC WITH DIFFERENTIAL/PLATELET
Basophils Absolute: 0 10*3/uL (ref 0.0–0.1)
Basophils Relative: 0.7 % (ref 0.0–3.0)
Eosinophils Absolute: 0.1 10*3/uL (ref 0.0–0.7)
Eosinophils Relative: 2.2 % (ref 0.0–5.0)
HCT: 42.7 % (ref 39.0–52.0)
Hemoglobin: 14.3 g/dL (ref 13.0–17.0)
Lymphocytes Relative: 31.6 % (ref 12.0–46.0)
Lymphs Abs: 1.8 10*3/uL (ref 0.7–4.0)
MCHC: 33.6 g/dL (ref 30.0–36.0)
MCV: 87.9 fl (ref 78.0–100.0)
Monocytes Absolute: 0.4 10*3/uL (ref 0.1–1.0)
Monocytes Relative: 8 % (ref 3.0–12.0)
Neutro Abs: 3.2 10*3/uL (ref 1.4–7.7)
Neutrophils Relative %: 57.5 % (ref 43.0–77.0)
Platelets: 262 10*3/uL (ref 150.0–400.0)
RBC: 4.85 Mil/uL (ref 4.22–5.81)
RDW: 12.9 % (ref 11.5–15.5)
WBC: 5.6 10*3/uL (ref 4.0–10.5)

## 2022-07-23 LAB — MICROALBUMIN / CREATININE URINE RATIO
Creatinine,U: 41.8 mg/dL
Microalb Creat Ratio: 1.7 mg/g (ref 0.0–30.0)
Microalb, Ur: <0.7 mg/dL (ref 0.0–1.9)

## 2022-07-23 LAB — LDL CHOLESTEROL, DIRECT: Direct LDL: 96 mg/dL

## 2022-07-23 LAB — COMPREHENSIVE METABOLIC PANEL
ALT: 36 U/L (ref 0–53)
AST: 25 U/L (ref 0–37)
Albumin: 4.5 g/dL (ref 3.5–5.2)
Alkaline Phosphatase: 39 U/L (ref 39–117)
BUN: 10 mg/dL (ref 6–23)
CO2: 25 mEq/L (ref 19–32)
Calcium: 9.4 mg/dL (ref 8.4–10.5)
Chloride: 103 mEq/L (ref 96–112)
Creatinine, Ser: 1.01 mg/dL (ref 0.40–1.50)
GFR: 81.42 mL/min (ref >60.00)
Glucose, Bld: 93 mg/dL (ref 70–99)
Potassium: 4.1 mEq/L (ref 3.5–5.1)
Sodium: 139 mEq/L (ref 135–145)
Total Bilirubin: 0.3 mg/dL (ref 0.2–1.2)
Total Protein: 8.4 g/dL — ABNORMAL HIGH (ref 6.0–8.3)

## 2022-07-23 LAB — TSH: TSH: 1.57 u[IU]/mL (ref 0.35–5.50)

## 2022-07-23 LAB — PSA: PSA: 1.16 ng/mL (ref 0.10–4.00)

## 2022-07-23 LAB — HEMOGLOBIN A1C: Hgb A1c MFr Bld: 6.3 % (ref 4.6–6.5)

## 2022-07-23 LAB — VITAMIN D 25 HYDROXY (VIT D DEFICIENCY, FRACTURES): VITD: 28.48 ng/mL — ABNORMAL LOW (ref 30.00–100.00)

## 2022-07-23 MED ORDER — CARVEDILOL 3.125 MG PO TABS: 3.1250 mg | ORAL_TABLET | Freq: Two times a day (BID) | ORAL | 2 refills | Status: DC

## 2022-07-23 NOTE — Patient Instructions (Addendum)
[email or web address]     a mail order online company that has an extensive selection of  compression socks for  use when travelling ( transatlantic flights)   [facility] Spotted Fever  can present with fever and a headache, NOT a rash; s the occurrence of these symptoms during spring/summer/fall in the context of recent tick exposure is RMSF until proven otherwise and should be treated immediately with doxycycline.  I will send  this rx to your pharmacy to use if you develop these symptoms.  Let me know if this occurs so we can set you up for the appropriate testing .

## 2022-07-23 NOTE — Progress Notes (Signed)
Patient ID: Kevin Barry, male    DOB: October 13, 1960  Age: 62 y.o. MRN: [id number]  The patient is here for annual preventive examination and management of other chronic and acute problems.   The risk factors are reflected in the social history.   The roster of all physicians providing medical care to patient - is listed in the Snapshot section of the chart.   Activities of daily living:  The patient is 100% independent in all ADLs: dressing, toileting, feeding as well as independent mobility   Home safety : The patient has smoke detectors in the home. They wear seatbelts.  There are no unsecured firearms at home. There is no violence in the home.    There is no risks for hepatitis, STDs or HIV. There is no   history of blood transfusion. They have no travel history to infectious disease endemic areas of the world.   The patient has seen their dentist in the last six month. They have seen their eye doctor in the last year. The patinet  denies slight hearing difficulty with regard to whispered voices and some television programs.  They have deferred audiologic testing in the last year.  They do not  have excessive sun exposure. Discussed the need for sun protection: hats, long sleeves and use of sunscreen if there is significant sun exposure.    Diet: the importance of a healthy diet is discussed. They do have a healthy diet.   The benefits of regular aerobic exercise were discussed. The patient  exercises  3 to 5 days per week  for  60 minutes.    Depression screen: there are no signs or vegative symptoms of depression- irritability, change in appetite, anhedonia, sadness/tearfullness.   The following portions of the patient's history were reviewed and updated as appropriate: allergies, current medications, past family history, past medical history,  past surgical history, past social history  and problem list.   Visual acuity was not assessed per patient preference since the patient has regular  follow up with an  ophthalmologist. Hearing and body mass index were assessed and reviewed.    During the course of the visit the patient was educated and counseled about appropriate screening and preventive services including : fall prevention , diabetes screening, nutrition counseling, colorectal cancer screening, and recommended immunizations.    Chief Complaint:   1)  visited mother in March. mother has had recurrent CA initially Hodgkins  now has a tumor in the spine and sinuses  2) mood is stable.  Using Lunesta prn and zoloft sees Maryruth Bun every 3 months  3) exercising  .  Weght is stable.  Never tobacco ,  occl alchol.   4) HTN:    checking at home 118/83  most days    Review of Symptoms  Patient denies headache, fevers, malaise, unintentional weight loss, skin rash, eye pain, sinus congestion and sinus pain, sore throat, dysphagia,  hemoptysis , cough, dyspnea, wheezing, chest pain, palpitations, orthopnea, edema, abdominal pain, nausea, melena, diarrhea, constipation, flank pain, dysuria, hematuria, urinary  Frequency, nocturia, numbness, tingling, seizures,  Focal weakness, Loss of consciousness,  Tremor, insomnia, depression, anxiety, and suicidal ideation.    Physical Exam:  BP 118/80   Pulse 77   Temp 97.8 F (36.6 C) (Oral)   Ht 6\' 2"  (1.88 m)   Wt 229 lb 6.4 oz (104.1 kg)   SpO2 96%   BMI 29.45 kg/m    Physical Exam Vitals reviewed.  Constitutional:  General: He is not in acute distress.    Appearance: Normal appearance. He is normal weight. He is not ill-appearing, toxic-appearing or diaphoretic.  HENT:     Head: Normocephalic and atraumatic.     Right Ear: Tympanic membrane, ear canal and external ear normal. There is no impacted cerumen.     Left Ear: Tympanic membrane, ear canal and external ear normal. There is no impacted cerumen.     Nose: Nose normal.     Mouth/Throat:     Mouth: Mucous membranes are moist.     Pharynx: Oropharynx is clear.  Eyes:      General: No scleral icterus.       Right eye: No discharge.        Left eye: No discharge.     Conjunctiva/sclera: Conjunctivae normal.  Neck:     Thyroid: No thyromegaly.     Vascular: No carotid bruit or JVD.  Cardiovascular:     Rate and Rhythm: Normal rate and regular rhythm.     Heart sounds: Normal heart sounds.  Pulmonary:     Effort: Pulmonary effort is normal. No respiratory distress.     Breath sounds: Normal breath sounds.  Abdominal:     General: Bowel sounds are normal.     Palpations: Abdomen is soft. There is no mass.     Tenderness: There is no abdominal tenderness. There is no guarding or rebound.  Musculoskeletal:        General: Normal range of motion.     Cervical back: Normal range of motion and neck supple.  Lymphadenopathy:     Cervical: No cervical adenopathy.  Skin:    General: Skin is warm and dry.  Neurological:     General: No focal deficit present.     Mental Status: He is alert and oriented to person, place, and time. Mental status is at baseline.  Psychiatric:        Mood and Affect: Mood normal.        Behavior: Behavior normal.        Thought Content: Thought content normal.        Judgment: Judgment normal.    Assessment and Plan: Essential hypertension Assessment & Plan: Well controlled on current regimen. Renal function stable, no changes today.  Orders: -     Comprehensive metabolic panel -     Microalbumin / creatinine urine ratio  Encounter for preventive health examination Assessment & Plan: age appropriate education and counseling updated, referrals for preventative services and immunizations addressed, dietary and smoking counseling addressed, most recent labs reviewed.  I have personally reviewed and have noted:   1) the patient's medical and social history 2) The pt's use of alcohol, tobacco, and illicit drugs 3) The patient's current medications and supplements 4) Functional ability including ADL's, fall risk, home  safety risk, hearing and visual impairment 5) Diet and physical activities 6) Evidence for depression or mood disorder 7) The patient's height, weight, and BMI have been recorded in the chart   I have made referrals, and provided counseling and education based on review of the above    Prediabetes -     Comprehensive metabolic panel -     Hemoglobin A1c -     Microalbumin / creatinine urine ratio  Hypercholesteremia -     Lipid panel -     CBC with Differential/Platelet -     LDL cholesterol, direct  Long-term use of high-risk medication -     TSH  Prostate cancer screening -     PSA  Insomnia secondary to depression with anxiety Assessment & Plan: Now managed with Lunesta 1 mg by Dr Maryruth Bun .  encouraged to practice mindfulness exercises and resume daily exercise as well    Vitamin D deficiency -     VITAMIN D 25 Hydroxy (Vit-D Deficiency, Fractures)  Other orders -     Carvedilol; Take 1 tablet (3.125 mg total) by mouth 2 (two) times daily with a meal.  Dispense: 180 tablet; Refill: 2    Return in about 1 year (around 07/23/2023).  Sherlene Shams, MD

## 2022-07-23 NOTE — Assessment & Plan Note (Signed)
Well controlled on current regimen. Renal function stable, no changes today. 

## 2022-07-23 NOTE — Assessment & Plan Note (Signed)

## 2022-07-23 NOTE — Assessment & Plan Note (Signed)
Now managed with Lunesta 1 mg by Dr Kapur .  encouraged to practice mindfulness exercises and resume daily exercise as well  

## 2022-07-29 DIAGNOSIS — F411 Generalized anxiety disorder: Secondary | ICD-10-CM | POA: Diagnosis not present

## 2022-07-29 DIAGNOSIS — F330 Major depressive disorder, recurrent, mild: Secondary | ICD-10-CM | POA: Diagnosis not present

## 2022-08-13 ENCOUNTER — Other Ambulatory Visit: Payer: Self-pay | Admitting: Internal Medicine

## 2022-10-20 ENCOUNTER — Telehealth: Payer: BC Managed Care – PPO | Admitting: Physician Assistant

## 2022-10-20 DIAGNOSIS — L259 Unspecified contact dermatitis, unspecified cause: Secondary | ICD-10-CM | POA: Diagnosis not present

## 2022-10-20 MED ORDER — TRIAMCINOLONE ACETONIDE 0.1 % EX CREA
1.0000 | TOPICAL_CREAM | Freq: Two times a day (BID) | CUTANEOUS | 0 refills | Status: AC
Start: 2022-10-20 — End: ?

## 2022-10-20 NOTE — Progress Notes (Signed)
E Visit for Rash  We are sorry that you are not feeling well. Here is how we plan to help!  Based on what you shared with me it looks like you have contact dermatitis.  Contact dermatitis is a skin rash caused by something that touches the skin and causes irritation or inflammation.  Your skin may be red, swollen, dry, cracked, and itch.  The rash should go away in a few days but can last a few weeks.  If you get a rash, it's important to figure out what caused it so the irritant can be avoided in the future. Because this is localized, I am sending in a prescription steroid cream to apply twice daily as directed. You can also apply topical witch hazel astringent to the area as well to help soothe the area and to dry the area up a bit.   HOME CARE:  Take cool showers and avoid direct sunlight. Apply cool compress or wet dressings. Take a bath in an oatmeal bath.  Sprinkle content of one Aveeno packet under running faucet with comfortably warm water.  Bathe for 15-20 minutes, 1-2 times daily.  Pat dry with a towel. Do not rub the rash. Use hydrocortisone cream. Take an antihistamine like Benadryl for widespread rashes that itch.  The adult dose of Benadryl is 25-50 mg by mouth 4 times daily. Caution:  This type of medication may cause sleepiness.  Do not drink alcohol, drive, or operate dangerous machinery while taking antihistamines.  Do not take these medications if you have prostate enlargement.  Read package instructions thoroughly on all medications that you take.  GET HELP RIGHT AWAY IF:  Symptoms don't go away after treatment. Severe itching that persists. If you rash spreads or swells. If you rash begins to smell. If it blisters and opens or develops a yellow-brown crust. You develop a fever. You have a sore throat. You become short of breath.  MAKE SURE YOU:  Understand these instructions. Will watch your condition. Will get help right away if you are not doing well or get  worse.  Thank you for choosing an e-visit.  Your e-visit answers were reviewed by a board certified advanced clinical practitioner to complete your personal care plan. Depending upon the condition, your plan could have included both over the counter or prescription medications.  Please review your pharmacy choice. Make sure the pharmacy is open so you can pick up prescription now. If there is a problem, you may contact your provider through [employer] and have the prescription routed to another pharmacy.  Your safety is important to [place]. If you have drug allergies check your prescription carefully.   For the next 24 hours you can use MyChart to ask questions about today's visit, request a non-urgent call back, or ask for a work or school excuse. You will get an email in the next two days asking about your experience. I hope that your e-visit has been valuable and will speed your recovery.

## 2022-10-20 NOTE — Progress Notes (Signed)
I have spent 5 minutes in review of e-visit questionnaire, review and updating patient chart, medical decision making and response to patient.   William Cody Martin, PA-C    

## 2022-10-27 DIAGNOSIS — F330 Major depressive disorder, recurrent, mild: Secondary | ICD-10-CM | POA: Diagnosis not present

## 2022-10-27 DIAGNOSIS — F411 Generalized anxiety disorder: Secondary | ICD-10-CM | POA: Diagnosis not present

## 2022-11-19 ENCOUNTER — Telehealth: Payer: BC Managed Care – PPO | Admitting: Nurse Practitioner

## 2022-11-19 DIAGNOSIS — L089 Local infection of the skin and subcutaneous tissue, unspecified: Secondary | ICD-10-CM | POA: Diagnosis not present

## 2022-11-19 DIAGNOSIS — L309 Dermatitis, unspecified: Secondary | ICD-10-CM | POA: Diagnosis not present

## 2022-11-19 MED ORDER — MUPIROCIN 2 % EX OINT
1.0000 | TOPICAL_OINTMENT | Freq: Two times a day (BID) | CUTANEOUS | 0 refills | Status: AC
Start: 2022-11-19 — End: ?

## 2022-11-19 MED ORDER — PREDNISONE 10 MG PO TABS
ORAL_TABLET | ORAL | 0 refills | Status: DC
Start: 2022-11-19 — End: 2023-03-05

## 2022-11-19 NOTE — Progress Notes (Signed)
E Visit for Rash  We are sorry that you are not feeling well. Here is how we plan to help!  Based on what you shared with me it looks like you have contact dermatitis.  Contact dermatitis is a skin rash caused by something that touches the skin and causes irritation or inflammation.  Your skin may be red, swollen, dry, cracked, and itch.  The rash should go away in a few days but can last a few weeks.  If you get a rash, it's important to figure out what caused it so the irritant can be avoided in the future. and I am prescribing a two week course of steroids (37 tablets of 10 mg prednisone).  Days 1-4 take 4 tablets (40 mg) daily  Days 5-8 take 3 tablets (30 mg) daily, Days 9-11 take 2 tablets (20 mg) daily, Days 12-14 take 1 tablet (10 mg) daily.    I am going to give you a different ointment to use on the one spot on your chest:  Topical mupiricin You can apply this 1-2 times daily      HOME CARE:  Take cool showers and avoid direct sunlight. Apply cool compress or wet dressings. Take a bath in an oatmeal bath.  Sprinkle content of one Aveeno packet under running faucet with comfortably warm water.  Bathe for 15-20 minutes, 1-2 times daily.  Pat dry with a towel. Do not rub the rash. Use hydrocortisone cream. Take an antihistamine like Benadryl for widespread rashes that itch.  The adult dose of Benadryl is 25-50 mg by mouth 4 times daily. Caution:  This type of medication may cause sleepiness.  Do not drink alcohol, drive, or operate dangerous machinery while taking antihistamines.  Do not take these medications if you have prostate enlargement.  Read package instructions thoroughly on all medications that you take.  GET HELP RIGHT AWAY IF:  Symptoms don't go away after treatment. Severe itching that persists. If you rash spreads or swells. If you rash begins to smell. If it blisters and opens or develops a yellow-brown crust. You develop a fever. You have a sore throat. You  become short of breath.  MAKE SURE YOU:  Understand these instructions. Will watch your condition. Will get help right away if you are not doing well or get worse.  Thank you for choosing an e-visit.  Your e-visit answers were reviewed by a board certified advanced clinical practitioner to complete your personal care plan. Depending upon the condition, your plan could have included both over the counter or prescription medications.  Please review your pharmacy choice. Make sure the pharmacy is open so you can pick up prescription now. If there is a problem, you may contact your provider through [employer] and have the prescription routed to another pharmacy.  Your safety is important to [place]. If you have drug allergies check your prescription carefully.   For the next 24 hours you can use MyChart to ask questions about today's visit, request a non-urgent call back, or ask for a work or school excuse. You will get an email in the next two days asking about your experience. I hope that your e-visit has been valuable and will speed your recovery.   Meds ordered this encounter  Medications   predniSONE (DELTASONE) 10 MG tablet    Sig: Take 4 tablets (40mg ) on days 1-4, then 3 tablets (30mg ) on days 5-8, then 2 tablets (20mg ) on days 9-11, then 1 tablet daily for days 12-14.  Take with food.    Dispense:  37 tablet    Refill:  0   mupirocin ointment (BACTROBAN) 2 %    Sig: Apply 1 Application topically 2 (two) times daily.    Dispense:  22 g    Refill:  0    I spent approximately 5 minutes reviewing the patient's history, current symptoms and coordinating their care today.

## 2023-01-10 ENCOUNTER — Other Ambulatory Visit: Payer: Self-pay | Admitting: Internal Medicine

## 2023-01-22 ENCOUNTER — Ambulatory Visit (INDEPENDENT_AMBULATORY_CARE_PROVIDER_SITE_OTHER): Payer: BC Managed Care – PPO

## 2023-01-22 DIAGNOSIS — Z23 Encounter for immunization: Secondary | ICD-10-CM

## 2023-01-22 NOTE — Progress Notes (Signed)
Patient presented for Regular flu vaccine to right deltoid, patient voiced no concerns nor showed any signs of distress during injection

## 2023-02-11 ENCOUNTER — Other Ambulatory Visit: Payer: Self-pay | Admitting: Internal Medicine

## 2023-02-17 DIAGNOSIS — F330 Major depressive disorder, recurrent, mild: Secondary | ICD-10-CM | POA: Diagnosis not present

## 2023-02-17 DIAGNOSIS — F411 Generalized anxiety disorder: Secondary | ICD-10-CM | POA: Diagnosis not present

## 2023-03-05 ENCOUNTER — Telehealth: Payer: BC Managed Care – PPO | Admitting: Physician Assistant

## 2023-03-05 DIAGNOSIS — L232 Allergic contact dermatitis due to cosmetics: Secondary | ICD-10-CM | POA: Diagnosis not present

## 2023-03-05 MED ORDER — PREDNISONE 10 MG (21) PO TBPK
ORAL_TABLET | ORAL | 0 refills | Status: AC
Start: 2023-03-05 — End: ?

## 2023-03-05 NOTE — Progress Notes (Signed)
E Visit for Rash  We are sorry that you are not feeling well. Here is how we plan to help!  Based on what you shared with me it looks like you have contact dermatitis.  Contact dermatitis is a skin rash caused by something that touches the skin and causes irritation or inflammation.  Your skin may be red, swollen, dry, cracked, and itch.  The rash should go away in a few days but can last a few weeks.  If you get a rash, it's important to figure out what caused it so the irritant can be avoided in the future.  Prednisone 10 mg daily for 6 days (see taper instructions below)  Directions for 6 day taper: Day 1: 2 tablets before breakfast, 1 after both lunch & dinner and 2 at bedtime Day 2: 1 tab before breakfast, 1 after both lunch & dinner and 2 at bedtime Day 3: 1 tab at each meal & 1 at bedtime Day 4: 1 tab at breakfast, 1 at lunch, 1 at bedtime Day 5: 1 tab at breakfast & 1 tab at bedtime Day 6: 1 tab at breakfast   HOME CARE:  Take cool showers and avoid direct sunlight. Apply cool compress or wet dressings. Take a bath in an oatmeal bath.  Sprinkle content of one Aveeno packet under running faucet with comfortably warm water.  Bathe for 15-20 minutes, 1-2 times daily.  Pat dry with a towel. Do not rub the rash. Use hydrocortisone cream. Take an antihistamine like Benadryl for widespread rashes that itch.  The adult dose of Benadryl is 25-50 mg by mouth 4 times daily. Caution:  This type of medication may cause sleepiness.  Do not drink alcohol, drive, or operate dangerous machinery while taking antihistamines.  Do not take these medications if you have prostate enlargement.  Read package instructions thoroughly on all medications that you take.  GET HELP RIGHT AWAY IF:  Symptoms don't go away after treatment. Severe itching that persists. If you rash spreads or swells. If you rash begins to smell. If it blisters and opens or develops a yellow-brown crust. You develop a  fever. You have a sore throat. You become short of breath.  MAKE SURE YOU:  Understand these instructions. Will watch your condition. Will get help right away if you are not doing well or get worse.  Thank you for choosing an e-visit.  Your e-visit answers were reviewed by a board certified advanced clinical practitioner to complete your personal care plan. Depending upon the condition, your plan could have included both over the counter or prescription medications.  Please review your pharmacy choice. Make sure the pharmacy is open so you can pick up prescription now. If there is a problem, you may contact your provider through [employer] and have the prescription routed to another pharmacy.  Your safety is important to [place]. If you have drug allergies check your prescription carefully.   For the next 24 hours you can use MyChart to ask questions about today's visit, request a non-urgent call back, or ask for a work or school excuse. You will get an email in the next two days asking about your experience. I hope that your e-visit has been valuable and will speed your recovery.  I have spent 5 minutes in review of e-visit questionnaire, review and updating patient chart, medical decision making and response to patient.   Mar Daring, PA-C

## 2023-03-11 ENCOUNTER — Other Ambulatory Visit: Payer: Self-pay | Admitting: Internal Medicine

## 2023-03-11 ENCOUNTER — Telehealth: Payer: BC Managed Care – PPO | Admitting: Nurse Practitioner

## 2023-03-11 DIAGNOSIS — R21 Rash and other nonspecific skin eruption: Secondary | ICD-10-CM

## 2023-03-11 NOTE — Progress Notes (Signed)
Kevin Barry,  I reviewed your chart and since we have seen you multiple times for skin eruptions we would like you to see your primary care provider and discuss a referral to dermatology.   You may continue to use mupirocin on the area until that time   Thank you Viviano Simas   I feel your condition warrants further evaluation and I recommend that you be seen for a face to face visit.  Please contact your primary care physician practice to be seen. Many offices offer virtual options to be seen via video if you are not comfortable going in person to a medical facility at this time.  NOTE: You will NOT be charged for this eVisit.  If you do not have a PCP, [facility] offers a free physician referral service available at [phone]. Our trained staff has the experience, knowledge and resources to put you in touch with a physician who is right for you.    If you are having a true medical emergency please call 911.   Your e-visit answers were reviewed by a board certified advanced clinical practitioner to complete your personal care plan.  Thank you for using e-Visits.

## 2023-03-22 ENCOUNTER — Telehealth: Payer: BC Managed Care – PPO | Admitting: Physician Assistant

## 2023-03-22 DIAGNOSIS — T63481A Toxic effect of venom of other arthropod, accidental (unintentional), initial encounter: Secondary | ICD-10-CM | POA: Diagnosis not present

## 2023-03-22 MED ORDER — DOXYCYCLINE HYCLATE 100 MG PO TABS
100.0000 mg | ORAL_TABLET | Freq: Two times a day (BID) | ORAL | 0 refills | Status: DC
Start: 2023-03-22 — End: 2023-06-17

## 2023-03-22 NOTE — Progress Notes (Signed)
I have spent 5 minutes in review of e-visit questionnaire, review and updating patient chart, medical decision making and response to patient.   Mia Milan Cody Jacklynn Dehaas, PA-C    

## 2023-03-22 NOTE — Progress Notes (Signed)
E-Visit for Insect Sting  Thank you for describing the insect sting for [place].  Here is how we plan to help!  A sting that we will treat with a short course of antibiotic due to concern of secondary infection.  The 2 greatest risks from insect stings are allergic reaction, which can be fatal in some people and infection, which is more common and less serious.  Bees, wasps, yellow jackets, and hornets belong to a class of insects called Hymenoptera.  Most insect stings cause only minor discomfort.  Stings can happen anywhere on the body and can be painful.  Most stings are from honey bees or yellow jackets.  Fire ants can sting multiple times.  The sites of the stings are more likely to become infected.    Based on your information I have: Sent in a prescription for Doxycycline to take twice daily for 7 days.  What can be used to prevent Insect Stings?  Insect repellant with at least 20% DEET.  Wearing long pants and shirts with socks and shoes.  Wear dark or drab-colored clothes rather than bright colors.  Avoid using perfumes and hair sprays; these attract insects.  HOME CARE ADVICE:  1. Stinger removal: The stinger looks like a tiny black dot in the sting. Use a fingernail, credit card edge, or knife-edge to scrape it off.  Don't pull it out because it squeezes out more venom. If the stinger is below the skin surface, leave it alone.  It will be shed with normal skin healing. 2. Use cold compresses to the area of the sting for 10-20 minutes.  You may repeat this as needed to relieve symptoms of pain and swelling. 3.  For pain relief, take acetominophen 650 mg 4-6 hours as needed or ibuprofen 400 mg every 6-8 hours as needed or naproxen 250-500 mg every 12 hours as needed. 4.  You can also use hydrocortisone cream 0.5% or 1% up to 4 times daily as needed for itching. 5.  If the sting becomes very itchy, take Benadryl 25-50 mg, follow directions on box. 6.  Wash the area 2-3 times daily  with antibacterial soap and warm water. 7. Call your Doctor if: Fever, a severe headache, or rash occur in the next 2 weeks. Sting area begins to look infected. Redness and swelling worsens after home treatment. Your current symptoms become worse.    MAKE SURE YOU:  Understand these instructions. Will watch your condition. Will get help right away if you are not doing well or get worse.  Thank you for choosing an e-visit.  Your e-visit answers were reviewed by a board certified advanced clinical practitioner to complete your personal care plan. Depending upon the condition, your plan could have included both over the counter or prescription medications.  Please review your pharmacy choice. Make sure the pharmacy is open so you can pick up prescription now. If there is a problem, you may contact your provider through [employer] and have the prescription routed to another pharmacy.  Your safety is important to [place]. If you have drug allergies check your prescription carefully.   For the next 24 hours you can use MyChart to ask questions about today's visit, request a non-urgent call back, or ask for a work or school excuse. You will get an email in the next two days asking about your experience. I hope that your e-visit has been valuable and will speed your recovery.

## 2023-03-25 ENCOUNTER — Encounter: Payer: Self-pay | Admitting: Internal Medicine

## 2023-03-26 MED ORDER — CARVEDILOL 3.125 MG PO TABS: 3.1250 mg | ORAL_TABLET | Freq: Two times a day (BID) | ORAL | 0 refills | Status: DC

## 2023-06-06 ENCOUNTER — Telehealth: Admitting: Physician Assistant

## 2023-06-06 ENCOUNTER — Other Ambulatory Visit: Payer: Self-pay | Admitting: Internal Medicine

## 2023-06-06 DIAGNOSIS — L731 Pseudofolliculitis barbae: Secondary | ICD-10-CM

## 2023-06-07 MED ORDER — CEPHALEXIN 500 MG PO CAPS: 500.0000 mg | ORAL_CAPSULE | Freq: Four times a day (QID) | ORAL | 0 refills | Status: DC

## 2023-06-07 NOTE — Progress Notes (Signed)
 E-Visit for Cellulitis/Superficial skin INfection.  We are sorry that you are not feeling well. Here is how we plan to help!  Based on what you shared with me it looks like you have cellulitis.  Cellulitis looks like areas of skin redness, swelling, and warmth; it develops as a result of bacteria entering under the skin. Little red spots and/or bleeding can be seen in skin, and tiny surface sacs containing fluid can occur. Fever can be present.   I have prescribed:  Keflex 500mg  take one by mouth four times a day for 5 days  HOME CARE:  Take your medications as ordered and take all of them, even if the skin irritation appears to be healing.   GET HELP RIGHT AWAY IF:  Symptoms that don't begin to go away within 48 hours. Severe redness persists or worsens If the area turns color, spreads or swells. If it blisters and opens, develops yellow-brown crust or bleeds. You develop a fever or chills. If the pain increases or becomes unbearable.  Are unable to keep fluids and food down.  MAKE SURE YOU   Understand these instructions. Will watch your condition. Will get help right away if you are not doing well or get worse.  Thank you for choosing an e-visit.  Your e-visit answers were reviewed by a board certified advanced clinical practitioner to complete your personal care plan. Depending upon the condition, your plan could have included both over the counter or prescription medications.  Please review your pharmacy choice. Make sure the pharmacy is open so you can pick up prescription now. If there is a problem, you may contact your provider through [employer] and have the prescription routed to another pharmacy.  Your safety is important to [place]. If you have drug allergies check your prescription carefully.   For the next 24 hours you can use MyChart to ask questions about today's visit, request a non-urgent call back, or ask for a work or school excuse. You will get an email in  the next two days asking about your experience. I hope that your e-visit has been valuable and will speed your recovery.   I have spent 5 minutes in review of e-visit questionnaire, review and updating patient chart, medical decision making and response to patient.   Margaretann Loveless, PA-C

## 2023-06-16 DIAGNOSIS — F330 Major depressive disorder, recurrent, mild: Secondary | ICD-10-CM | POA: Diagnosis not present

## 2023-06-16 DIAGNOSIS — F411 Generalized anxiety disorder: Secondary | ICD-10-CM | POA: Diagnosis not present

## 2023-06-17 ENCOUNTER — Ambulatory Visit (INDEPENDENT_AMBULATORY_CARE_PROVIDER_SITE_OTHER): Admitting: Nurse Practitioner

## 2023-06-17 VITALS — BP 124/80 | HR 66 | Temp 98.3°F | Ht 74.0 in | Wt 228.6 lb

## 2023-06-17 DIAGNOSIS — L309 Dermatitis, unspecified: Secondary | ICD-10-CM | POA: Diagnosis not present

## 2023-06-17 MED ORDER — PREDNISONE 10 MG PO TABS: ORAL_TABLET | ORAL | 0 refills | Status: DC

## 2023-06-17 MED ORDER — CLOTRIMAZOLE-BETAMETHASONE 1-0.05 % EX CREA: 1.0000 | TOPICAL_CREAM | Freq: Every day | CUTANEOUS | 0 refills | Status: DC

## 2023-06-17 NOTE — Progress Notes (Signed)
 Established Patient Office Visit  Subjective:  Patient ID: Kevin Barry, male    DOB: 21-Jul-1960  Age: 63 y.o. MRN: [id number]  CC:  Chief Complaint  Patient presents with   Acute Visit    Rash on & under arms & chest Itchy  Discussed the use of a AI scribe software for clinical note transcription with the patient, who gave verbal consent to proceed.   HPI Kevin Barry is a 63 year old male who presents with recurrent itchy rash flare-ups.  He has been experiencing recurrent flare-ups of an itchy rash over the last couple of months. The rash is primarily located under his arms and on the inside of his arms, with the left side being slightly worse. It sometimes extends to his chest and is particularly itchy around the armpit area.  He recalls a separate skin issue about two weeks ago, where a lump appeared on his face, became inflamed, and was treated successfully with antibiotics. He has a history of similar skin flare-ups over the years, which have occasionally required a course of prednisone to resolve. He has been using topical treatments such as Benadryl cream and a prescription steroid cream for temporary relief. He also takes Benadryl tablets, which cause drowsiness, to manage the itching.  No changes in soaps, deodorants, or perfumes that could have triggered the rash. No breathing issues, fever, or pain associated with the rash,  HPI   Past Medical History:  Diagnosis Date   Allergy    Depression    Disorder of keratinization    right eye.    History of chicken pox    Hypertension     Past Surgical History:  Procedure Laterality Date   COLONOSCOPY WITH PROPOFOL N/A 12/05/2014   Procedure: COLONOSCOPY WITH PROPOFOL;  Surgeon: Earline Mayotte, MD;  Location: [facility] ENDOSCOPY;  Service: Endoscopy;  Laterality: N/A;   TONSILLECTOMY AND ADENOIDECTOMY      Family History  Problem Relation Age of Onset   Cancer Mother        Hodgkin disease   Heart disease Mother 66        aortic valve replacement   Heart attack Mother 59       has stent placed   Heart disease Father 65       MI   Early death Father 20       acute MI   Heart attack Father    Crohn's disease Brother    Heart disease Maternal Grandmother    Stroke Maternal Grandmother    Cancer Maternal Grandmother        unsure which cancer   Cancer Maternal Grandfather        pancreatic cancer?   Heart disease Paternal Grandfather    Stroke Paternal Grandfather    Down syndrome Daughter     Social History   Socioeconomic History   Marital status: Married    Spouse name: Not on file   Number of children: Not on file   Years of education: Not on file   Highest education level: Bachelor's degree (e.g., BA, AB, BS)  Occupational History   Not on file  Tobacco Use   Smoking status: Never   Smokeless tobacco: Never  Substance and Sexual Activity   Alcohol use: Yes    Comment: maybe 1 glass of wine once a month   Drug use: No   Sexual activity: Yes    Birth control/protection: None  Other Topics Concern   Not on file  Social History Narrative   Not on file   Social Drivers of Health   Financial Resource Strain: Low Risk  (06/17/2023)   Overall Financial Resource Strain (CARDIA)    Difficulty of Paying Living Expenses: Not very hard  Food Insecurity: No Food Insecurity (06/17/2023)   Hunger Vital Sign    Worried About Running Out of Food in the Last Year: Never true    Ran Out of Food in the Last Year: Never true  Transportation Needs: No Transportation Needs (06/17/2023)   PRAPARE - Administrator, Civil Service (Medical): No    Lack of Transportation (Non-Medical): No  Physical Activity: Insufficiently Active (06/17/2023)   Exercise Vital Sign    Days of Exercise per Week: 2 days    Minutes of Exercise per Session: 30 min  Stress: Stress Concern Present (06/17/2023)   [employer] of Occupational Health - Occupational Stress Questionnaire    Feeling of Stress : To  some extent  Social Connections: Moderately Integrated (06/17/2023)   Social Connection and Isolation Panel [NHANES]    Frequency of Communication with Friends and Family: Twice a week    Frequency of Social Gatherings with Friends and Family: More than three times a week    Attends Religious Services: 1 to 4 times per year    Active Member of [employer] or Organizations: No    Attends Engineer, structural: Not on file    Marital Status: Married  Catering manager Violence: Not on file     Outpatient Medications Prior to Visit  Medication Sig Dispense Refill   acetaminophen (TYLENOL) 500 MG tablet Take 1,000 mg by mouth every 6 (six) hours as needed.     albuterol (VENTOLIN HFA) 108 (90 Base) MCG/ACT inhaler Inhale 2 puffs into the lungs every 6 (six) hours as needed for wheezing or shortness of breath. 18 g 0   carvedilol (COREG) 3.125 MG tablet Take 1 tablet (3.125 mg total) by mouth 2 (two) times daily with a meal. 180 tablet 0   eszopiclone (LUNESTA) 2 MG TABS tablet Take 2 mg by mouth at bedtime as needed for sleep.     fluticasone (FLOVENT HFA) 220 MCG/ACT inhaler Inhale 2 puffs into the lungs in the morning and at bedtime. 1 each 12   losartan (COZAAR) 50 MG tablet TAKE 1 TABLET DAILY 90 tablet 1   mupirocin ointment (BACTROBAN) 2 % Apply 1 Application topically 2 (two) times daily. 22 g 0   predniSONE (STERAPRED UNI-PAK 21 TAB) 10 MG (21) TBPK tablet 6 day taper; take as directed on package instructions 21 tablet 0   sertraline (ZOLOFT) 100 MG tablet Take 100 mg by mouth daily.     simvastatin (ZOCOR) 20 MG tablet TAKE 1 TABLET AT BEDTIME 90 tablet 0   triamcinolone cream (KENALOG) 0.1 % Apply 1 Application topically 2 (two) times daily. 30 g 0   cephALEXin (KEFLEX) 500 MG capsule Take 1 capsule (500 mg total) by mouth 4 (four) times daily. 20 capsule 0   doxycycline (VIBRA-TABS) 100 MG tablet Take 1 tablet (100 mg total) by mouth 2 (two) times daily. 14 tablet 0   No  facility-administered medications prior to visit.    Allergies  Allergen Reactions   Lisinopril Cough   Wellbutrin [Bupropion] Other (See Comments)    Increased aggressiveness and irritabiity   Penicillins Rash    ROS Review of Systems Negative unless indicated in HPI.    Objective:    Physical Exam Constitutional:  Appearance: Normal appearance.  Cardiovascular:     Rate and Rhythm: Normal rate and regular rhythm.     Pulses: Normal pulses.     Heart sounds: Normal heart sounds.  Musculoskeletal:     Cervical back: Normal range of motion.  Skin:    Findings: Rash present. Rash is macular.     Comments: Erythematous maculopapular rash located under the armpit. Excoriations noted as well.   Neurological:     General: No focal deficit present.     Mental Status: He is alert. Mental status is at baseline.  Psychiatric:        Mood and Affect: Mood normal.        Behavior: Behavior normal.        Thought Content: Thought content normal.        Judgment: Judgment normal.     BP 124/80   Pulse 66   Temp 98.3 F (36.8 C)   Ht 6\' 2"  (1.88 m)   Wt 228 lb 9.6 oz (103.7 kg)   SpO2 99%   BMI 29.35 kg/m  Wt Readings from Last 3 Encounters:  06/17/23 228 lb 9.6 oz (103.7 kg)  07/23/22 229 lb 6.4 oz (104.1 kg)  08/01/21 226 lb (102.5 kg)     Health Maintenance  Topic Date Due   COVID-19 Vaccine (4 - 2024-25 season) 07/03/2023 (Originally 11/29/2022)   Colonoscopy  12/04/2024   DTaP/Tdap/Td (3 - Td or Tdap) 08/04/2026   INFLUENZA VACCINE  Completed   Hepatitis C Screening  Completed   HIV Screening  Completed   Zoster Vaccines- Shingrix  Completed   HPV VACCINES  Aged Out    There are no preventive care reminders to display for this patient.  Lab Results  Component Value Date   TSH 1.57 07/23/2022   Lab Results  Component Value Date   WBC 5.6 07/23/2022   HGB 14.3 07/23/2022   HCT 42.7 07/23/2022   MCV 87.9 07/23/2022   PLT 262.0 07/23/2022   Lab  Results  Component Value Date   NA 139 07/23/2022   K 4.1 07/23/2022   CO2 25 07/23/2022   GLUCOSE 93 07/23/2022   BUN 10 07/23/2022   CREATININE 1.01 07/23/2022   BILITOT 0.3 07/23/2022   ALKPHOS 39 07/23/2022   AST 25 07/23/2022   ALT 36 07/23/2022   PROT 8.4 (H) 07/23/2022   ALBUMIN 4.5 07/23/2022   CALCIUM 9.4 07/23/2022   GFR 81.42 07/23/2022   Lab Results  Component Value Date   CHOL 153 07/23/2022   Lab Results  Component Value Date   HDL 40.20 07/23/2022   Lab Results  Component Value Date   LDLCALC 82 07/23/2022   Lab Results  Component Value Date   TRIG 153.0 (H) 07/23/2022   Lab Results  Component Value Date   CHOLHDL 4 07/23/2022   Lab Results  Component Value Date   HGBA1C 6.3 07/23/2022      Assessment & Plan:  Dermatitis Assessment & Plan: Recurrent pruritic erythematous papules on arms, axillae, chest. Likely contact dermatitis or environmental reaction. . - Prescribe tapering dose of prednisone, morning with food. - Prescribe steroid cream with antifungal properties for topical use. - Advise cold compresses for pruritus relief.   Other orders -     predniSONE; Take 4 tablets ( total 40 mg) by mouth for 2 days; take 3 tablets ( total 30 mg) by mouth for 2 days; take 2 tablets ( total 20 mg) by mouth for 1  day; take 1 tablet ( total 10 mg) by mouth for 1 day.  Dispense: 17 tablet; Refill: 0 -     Clotrimazole-Betamethasone; Apply 1 Application topically daily.  Dispense: 30 g; Refill: 0    Follow-up: No follow-ups on file.   Kara Dies, NP

## 2023-06-27 DIAGNOSIS — L309 Dermatitis, unspecified: Secondary | ICD-10-CM | POA: Insufficient documentation

## 2023-06-27 NOTE — Assessment & Plan Note (Signed)
 Recurrent pruritic erythematous papules on arms, axillae, chest. Likely contact dermatitis or environmental reaction. . - Prescribe tapering dose of prednisone, morning with food. - Prescribe steroid cream with antifungal properties for topical use. - Advise cold compresses for pruritus relief.

## 2023-07-15 ENCOUNTER — Other Ambulatory Visit: Payer: Self-pay | Admitting: Internal Medicine

## 2023-08-03 ENCOUNTER — Other Ambulatory Visit: Payer: Self-pay | Admitting: Internal Medicine

## 2023-08-31 ENCOUNTER — Other Ambulatory Visit: Payer: Self-pay | Admitting: Internal Medicine

## 2023-09-10 ENCOUNTER — Encounter: Payer: Self-pay | Admitting: Internal Medicine

## 2023-09-10 ENCOUNTER — Ambulatory Visit (INDEPENDENT_AMBULATORY_CARE_PROVIDER_SITE_OTHER): Admitting: Internal Medicine

## 2023-09-10 VITALS — BP 118/70 | HR 70 | Ht 74.0 in | Wt 229.4 lb

## 2023-09-10 DIAGNOSIS — I10 Essential (primary) hypertension: Secondary | ICD-10-CM | POA: Diagnosis not present

## 2023-09-10 DIAGNOSIS — Z125 Encounter for screening for malignant neoplasm of prostate: Secondary | ICD-10-CM

## 2023-09-10 DIAGNOSIS — E7800 Pure hypercholesterolemia, unspecified: Secondary | ICD-10-CM | POA: Diagnosis not present

## 2023-09-10 DIAGNOSIS — Z0000 Encounter for general adult medical examination without abnormal findings: Secondary | ICD-10-CM

## 2023-09-10 DIAGNOSIS — E559 Vitamin D deficiency, unspecified: Secondary | ICD-10-CM

## 2023-09-10 DIAGNOSIS — Z79899 Other long term (current) drug therapy: Secondary | ICD-10-CM | POA: Diagnosis not present

## 2023-09-10 DIAGNOSIS — R7303 Prediabetes: Secondary | ICD-10-CM

## 2023-09-10 DIAGNOSIS — Z1283 Encounter for screening for malignant neoplasm of skin: Secondary | ICD-10-CM

## 2023-09-10 LAB — MICROALBUMIN / CREATININE URINE RATIO
Creatinine,U: 22 mg/dL
Microalb Creat Ratio: UNDETERMINED mg/g (ref 0.0–30.0)
Microalb, Ur: <0.7 mg/dL

## 2023-09-10 LAB — LIPID PANEL
Cholesterol: 130 mg/dL (ref 0–200)
HDL: 34.9 mg/dL — ABNORMAL LOW (ref >39.00)
LDL Cholesterol: 64 mg/dL (ref 0–99)
NonHDL: 95.03
Total CHOL/HDL Ratio: 4
Triglycerides: 157 mg/dL — ABNORMAL HIGH (ref 0.0–149.0)
VLDL: 31.4 mg/dL (ref 0.0–40.0)

## 2023-09-10 LAB — CBC WITH DIFFERENTIAL/PLATELET
Basophils Absolute: 0 10*3/uL (ref 0.0–0.1)
Basophils Relative: 0.6 % (ref 0.0–3.0)
Eosinophils Absolute: 0.1 10*3/uL (ref 0.0–0.7)
Eosinophils Relative: 2.5 % (ref 0.0–5.0)
HCT: 39.9 % (ref 39.0–52.0)
Hemoglobin: 13.5 g/dL (ref 13.0–17.0)
Lymphocytes Relative: 25.9 % (ref 12.0–46.0)
Lymphs Abs: 1.4 10*3/uL (ref 0.7–4.0)
MCHC: 33.9 g/dL (ref 30.0–36.0)
MCV: 86.2 fl (ref 78.0–100.0)
Monocytes Absolute: 0.5 10*3/uL (ref 0.1–1.0)
Monocytes Relative: 8.7 % (ref 3.0–12.0)
Neutro Abs: 3.3 10*3/uL (ref 1.4–7.7)
Neutrophils Relative %: 62.3 % (ref 43.0–77.0)
Platelets: 224 10*3/uL (ref 150.0–400.0)
RBC: 4.63 Mil/uL (ref 4.22–5.81)
RDW: 12.6 % (ref 11.5–15.5)
WBC: 5.3 10*3/uL (ref 4.0–10.5)

## 2023-09-10 LAB — COMPREHENSIVE METABOLIC PANEL WITH GFR
ALT: 26 U/L (ref 0–53)
AST: 22 U/L (ref 0–37)
Albumin: 4.5 g/dL (ref 3.5–5.2)
Alkaline Phosphatase: 37 U/L — ABNORMAL LOW (ref 39–117)
BUN: 12 mg/dL (ref 6–23)
CO2: 30 meq/L (ref 19–32)
Calcium: 9.5 mg/dL (ref 8.4–10.5)
Chloride: 103 meq/L (ref 96–112)
Creatinine, Ser: 1.01 mg/dL (ref 0.40–1.50)
GFR: 80.78 mL/min (ref >60.00)
Glucose, Bld: 104 mg/dL — ABNORMAL HIGH (ref 70–99)
Potassium: 4.1 meq/L (ref 3.5–5.1)
Sodium: 139 meq/L (ref 135–145)
Total Bilirubin: 0.4 mg/dL (ref 0.2–1.2)
Total Protein: 7.9 g/dL (ref 6.0–8.3)

## 2023-09-10 LAB — LDL CHOLESTEROL, DIRECT: Direct LDL: 73 mg/dL

## 2023-09-10 LAB — HEMOGLOBIN A1C: Hgb A1c MFr Bld: 6.3 % (ref 4.6–6.5)

## 2023-09-10 MED ORDER — LOSARTAN POTASSIUM 50 MG PO TABS: 50.0000 mg | ORAL_TABLET | Freq: Every day | ORAL | 1 refills | Status: DC

## 2023-09-10 MED ORDER — SIMVASTATIN 20 MG PO TABS: 20.0000 mg | ORAL_TABLET | Freq: Every day | ORAL | 3 refills | Status: DC

## 2023-09-10 NOTE — Patient Instructions (Signed)
 Good to see you.  No changes were made to your medications today .   Referring you to [facility] for a skin cancer screening

## 2023-09-10 NOTE — Progress Notes (Unsigned)
 Patient ID: Oaklen Thiam, male    DOB: 03/07/1961  Age: 63 y.o. MRN: [id number]  The patient is here for annual preventive examination and management of other chronic and acute problems.   The risk factors are reflected in the social history.   The roster of all physicians providing medical care to patient - is listed in the Snapshot section of the chart.   Activities of daily living:  The patient is 100% independent in all ADLs: dressing, toileting, feeding as well as independent mobility   Home safety : The patient has smoke detectors in the home. They wear seatbelts.  There are no unsecured firearms at home. There is no violence in the home.    There is no risks for hepatitis, STDs or HIV. There is no   history of blood transfusion. They have no travel history to infectious disease endemic areas of the world.   The patient has seen their dentist in the last six month. They have seen their eye doctor in the last year. The patinet  denies slight hearing difficulty with regard to whispered voices and some television programs.  They have deferred audiologic testing in the last year.  They do not  have excessive sun exposure. Discussed the need for sun protection: hats, long sleeves and use of sunscreen if there is significant sun exposure.    Diet: the importance of a healthy diet is discussed. They do have a healthy diet.   The benefits of regular aerobic exercise were discussed. The patient  exercises  3 to 5 days per week  for  60 minutes.    Depression screen: there are no signs or vegative symptoms of depression- irritability, change in appetite, anhedonia, sadness/tearfullness.   The following portions of the patient's history were reviewed and updated as appropriate: allergies, current medications, past family history, past medical history,  past surgical history, past social history  and problem list.   Visual acuity was not assessed per patient preference since the patient has regular  follow up with an  ophthalmologist. Hearing and body mass index were assessed and reviewed.    During the course of the visit the patient was educated and counseled about appropriate screening and preventive services including : fall prevention , diabetes screening, nutrition counseling, colorectal cancer screening, and recommended immunizations.    Chief Complaint:   FAtigue from frequent travel to [place] .  Mother has entered Hospice for terminal CA .  Exercise has fallen by the wayside      Review of Symptoms  Patient denies headache, fevers, malaise, unintentional weight loss, skin rash, eye pain, sinus congestion and sinus pain, sore throat, dysphagia,  hemoptysis , cough, dyspnea, wheezing, chest pain, palpitations, orthopnea, edema, abdominal pain, nausea, melena, diarrhea, constipation, flank pain, dysuria, hematuria, urinary  Frequency, nocturia, numbness, tingling, seizures,  Focal weakness, Loss of consciousness,  Tremor, insomnia, depression, anxiety, and suicidal ideation.    Physical Exam:  BP 118/70   Pulse 70   Ht 6' 2 (1.88 m)   Wt 229 lb 6.4 oz (104.1 kg)   SpO2 97%   BMI 29.45 kg/m    Physical Exam Vitals reviewed.  Constitutional:      General: He is not in acute distress.    Appearance: Normal appearance. He is normal weight. He is not ill-appearing, toxic-appearing or diaphoretic.  HENT:     Head: Normocephalic.   Eyes:     General: No scleral icterus.       Right eye:  No discharge.        Left eye: No discharge.     Conjunctiva/sclera: Conjunctivae normal.    Cardiovascular:     Rate and Rhythm: Normal rate and regular rhythm.     Heart sounds: Normal heart sounds.  Pulmonary:     Effort: Pulmonary effort is normal. No respiratory distress.     Breath sounds: Normal breath sounds.   Musculoskeletal:        General: Normal range of motion.     Cervical back: Normal range of motion.   Skin:    General: Skin is warm and dry.   Neurological:      General: No focal deficit present.     Mental Status: He is alert and oriented to person, place, and time. Mental status is at baseline.   Psychiatric:        Mood and Affect: Mood normal.        Behavior: Behavior normal.        Thought Content: Thought content normal.        Judgment: Judgment normal.   Assessment and Plan: Essential hypertension  Encounter for preventive health examination  Prediabetes  Hypercholesteremia  Long-term use of high-risk medication  Prostate cancer screening    No follow-ups on file.  Thersia Flax, MD

## 2023-09-11 LAB — PSA: Prostate Specific Ag, Serum: 1.2 ng/mL (ref 0.0–4.0)

## 2023-09-11 LAB — VITAMIN D 25 HYDROXY (VIT D DEFICIENCY, FRACTURES): Vit D, 25-Hydroxy: 37 ng/mL (ref 30.0–100.0)

## 2023-09-11 LAB — TSH: TSH: 1.29 u[IU]/mL (ref 0.450–4.500)

## 2023-09-12 ENCOUNTER — Ambulatory Visit: Payer: Self-pay | Admitting: Internal Medicine

## 2023-09-12 NOTE — Assessment & Plan Note (Signed)

## 2023-09-12 NOTE — Assessment & Plan Note (Signed)
 Well controlled on current regimen. Renal function stable, no changes today.  Lab Results  Component Value Date   CREATININE 1.01 09/10/2023   Lab Results  Component Value Date   NA 139 09/10/2023   K 4.1 09/10/2023   CL 103 09/10/2023   CO2 30 09/10/2023   Lab Results  Component Value Date   MICROALBUR <0.7 09/10/2023   MICROALBUR <0.7 07/23/2022

## 2023-09-30 ENCOUNTER — Other Ambulatory Visit: Payer: Self-pay | Admitting: Internal Medicine

## 2023-10-27 ENCOUNTER — Telehealth: Admitting: Physician Assistant

## 2023-10-27 DIAGNOSIS — J069 Acute upper respiratory infection, unspecified: Secondary | ICD-10-CM | POA: Diagnosis not present

## 2023-10-27 MED ORDER — AZITHROMYCIN 250 MG PO TABS: ORAL_TABLET | ORAL | 0 refills | Status: DC

## 2023-10-27 MED ORDER — BENZONATATE 100 MG PO CAPS: 100.0000 mg | ORAL_CAPSULE | Freq: Three times a day (TID) | ORAL | 0 refills | Status: DC | PRN

## 2023-10-27 MED ORDER — BENZONATATE 200 MG PO CAPS: 200.0000 mg | ORAL_CAPSULE | Freq: Two times a day (BID) | ORAL | 0 refills | Status: DC | PRN

## 2023-10-27 NOTE — Progress Notes (Signed)
 I have spent 5 minutes in review of e-visit questionnaire, review and updating patient chart, medical decision making and response to patient.   Piedad Climes, PA-C

## 2023-10-27 NOTE — Progress Notes (Signed)

## 2023-10-27 NOTE — Progress Notes (Signed)

## 2023-11-01 ENCOUNTER — Ambulatory Visit (INDEPENDENT_AMBULATORY_CARE_PROVIDER_SITE_OTHER): Admitting: Family

## 2023-11-01 VITALS — BP 130/82 | HR 75 | Temp 97.9°F | Resp 20 | Ht 74.0 in | Wt 229.5 lb

## 2023-11-01 DIAGNOSIS — H18603 Keratoconus, unspecified, bilateral: Secondary | ICD-10-CM | POA: Diagnosis not present

## 2023-11-01 DIAGNOSIS — R519 Headache, unspecified: Secondary | ICD-10-CM | POA: Insufficient documentation

## 2023-11-01 NOTE — Progress Notes (Signed)
 Assessment & Plan:  Nonintractable headache, unspecified chronicity pattern, unspecified headache type Assessment & Plan: Reassuring neurologic exam.  Consider change from zoloft to Effexor after collaborating with psychiatry, Dr. Chipper.  Patient and I agreed to start increased dose of Zoloft 150 mg as advised by psychiatry first and if this is ineffective in managing headache, subsequent pain, consider trial of Effexor.  He is looking into taking short-term leave of absence from work.   Advised trial of magnesium citrate 400 mg daily for headache prevention. Limit Advil use due to the risk of rebound headaches.Pending appointment with [facility] specialist, Melissa B. Daluvoy, MD   Keratoconus of both eyes Assessment & Plan: Reassuring neurologic exam. Keratoconus, right eye, severe, with visual impairment, pain, chronic headache, and lightheadedness   The left eye compensates, increasing strain and headaches. He is awaiting evaluation by a cornea specialist at [facility].       Return precautions given.   Risks, benefits, and alternatives of the medications and treatment plan prescribed today were discussed, and patient expressed understanding.   Education regarding symptom management and diagnosis given to patient on AVS either electronically or printed.  No follow-ups on file.  Rollene Northern, FNP  Subjective:    Patient ID: Kevin Barry, male    DOB: 1960-10-06, 63 y.o.   MRN: [id number]  CC: Kevin Barry is a 63 y.o. male who presents today for an acute visit.    HPI: HPI Discussed the use of AI scribe software for clinical note transcription with the patient, who gave verbal consent to proceed.  History of Present Illness   Kevin Barry is a 63 year old male with keratoconus who presents with worsening eye pain and headaches.  He has a long-standing history of keratoconus, diagnosed in his mid-20s while living in [place]. There is significant worsening of his right eye vision,  described as a 'complete total blur' when the left eye is covered. The left eye, although affected, compensates for the right, allowing for fairly good vision overall. He experiences intense achiness in the right eye, likened to a 'very tired, overused muscle,' causing headaches and occasional dizziness. He does not wear corrective lenses as they no longer improve his vision.  Stress and anxiety exacerbate his eye condition significantly. Recently, he has been under considerable stress due to the passing of his mother on July 15th, which has involved frequent travel between [place] and his home to assist his family. This stress has intensified his symptoms, leading to increased headaches and eye pain.  He reports that his headaches are in the eye area and denies associated CP, syncope, nausea, vomiting, numbness, or facial weakness. No history of migraines or stroke. He experiences light sensitivity, particularly at night, affecting his ability to drive due to glare from lights.  He manages his symptoms with over-the-counter medications like Advil for headaches. He takes Advil more days than not, usually in the morning, and sometimes later in the day if needed. He also consumes three to four cups of coffee daily.  His family history includes his mother, who had a history of Hodgkin's lymphoma and a rare form of cancer that metastasized, leading to her recent passing. His father died of a heart attack at 50. His daughter, who has Down syndrome, is also at risk for keratoconus, and his nephew has the condition as well.  He has been on Zoloft for depression, which he has dealt with for decades, and has recently been advised to increase the dose due  to 150mg  per Dr Chipper due to  increased stress and anxiety. He has a history of using Wellbutrin , which was not effective for him.              Awaiting to get on corneal specialist from [facility], Dr Ferd.    H/o HTN He follows with Dr Chipper, last seen  06/16/23. Compliant with zoloft 100mg , lunesta 2mg .   No h/o CKD  CT calcium  score 05/15/2016 0  Never smoker  Allergies: Lisinopril , Wellbutrin  [bupropion ], and Penicillins Current Outpatient Medications on File Prior to Visit  Medication Sig Dispense Refill   acetaminophen (TYLENOL) 500 MG tablet Take 1,000 mg by mouth every 6 (six) hours as needed.     albuterol  (VENTOLIN  HFA) 108 (90 Base) MCG/ACT inhaler Inhale 2 puffs into the lungs every 6 (six) hours as needed for wheezing or shortness of breath. 18 g 0   carvedilol  (COREG ) 3.125 MG tablet TAKE 1 TABLET TWICE A DAY  WITH MEALS 180 tablet 1   clotrimazole -betamethasone  (LOTRISONE ) cream Apply 1 Application topically daily. 30 g 0   eszopiclone (LUNESTA) 2 MG TABS tablet Take 2 mg by mouth at bedtime as needed for sleep.     fluticasone  (FLOVENT  HFA) 220 MCG/ACT inhaler Inhale 2 puffs into the lungs in the morning and at bedtime. 1 each 12   losartan  (COZAAR ) 50 MG tablet TAKE 1 TABLET DAILY 30 tablet 0   mupirocin  ointment (BACTROBAN ) 2 % Apply 1 Application topically 2 (two) times daily. 22 g 0   sertraline (ZOLOFT) 100 MG tablet Take 100 mg by mouth daily.     simvastatin  (ZOCOR ) 20 MG tablet Take 1 tablet (20 mg total) by mouth at bedtime. 90 tablet 3   triamcinolone  cream (KENALOG ) 0.1 % Apply 1 Application topically 2 (two) times daily. 30 g 0   No current facility-administered medications on file prior to visit.    Review of Systems  Constitutional:  Negative for chills and fever.  HENT:  Negative for congestion.   Eyes:  Positive for photophobia, pain (right) and visual disturbance. Negative for discharge, redness and itching.  Respiratory:  Negative for cough.   Cardiovascular:  Negative for chest pain and palpitations.  Gastrointestinal:  Negative for nausea and vomiting.  Neurological:  Positive for dizziness. Negative for seizures, syncope and numbness.  Psychiatric/Behavioral:  Positive for sleep disturbance.  Negative for suicidal ideas. The patient is nervous/anxious.       Objective:    BP 130/82   Pulse 75   Temp 97.9 F (36.6 C)   Resp 20   Ht 6' 2 (1.88 m)   Wt 229 lb 8 oz (104.1 kg)   SpO2 98%   BMI 29.47 kg/m   BP Readings from Last 3 Encounters:  11/01/23 130/82  09/10/23 118/70  06/17/23 124/80   Wt Readings from Last 3 Encounters:  11/01/23 229 lb 8 oz (104.1 kg)  09/10/23 229 lb 6.4 oz (104.1 kg)  06/17/23 228 lb 9.6 oz (103.7 kg)      11/01/2023    9:29 AM 09/10/2023    8:54 AM 06/17/2023   11:38 AM  Depression screen PHQ 2/9  Decreased Interest 1 0 0  Down, Depressed, Hopeless 1 0 0  PHQ - 2 Score 2 0 0  Altered sleeping 2  0  Tired, decreased energy 1  0  Change in appetite 0  0  Feeling bad or failure about yourself  0  0  Trouble concentrating 0  0  Moving slowly or fidgety/restless 0  0  Suicidal thoughts 0  0  PHQ-9 Score 5  0  Difficult doing work/chores Very difficult  Not difficult at all     Physical Exam Vitals reviewed.  Constitutional:      Appearance: He is well-developed.  HENT:     Right Ear: Hearing normal.     Left Ear: Hearing normal.     Mouth/Throat:     Pharynx: Uvula midline. No posterior oropharyngeal erythema.  Eyes:     General: Lids are normal. Lids are everted, no foreign bodies appreciated.     Conjunctiva/sclera: Conjunctivae normal.     Pupils: Pupils are equal, round, and reactive to light.     Comments: Normal fundus bilaterally.  Cardiovascular:     Rate and Rhythm: Regular rhythm.     Heart sounds: Normal heart sounds.  Pulmonary:     Effort: Pulmonary effort is normal. No respiratory distress.     Breath sounds: Normal breath sounds. No wheezing, rhonchi or rales.  Lymphadenopathy:     Head:     Right side of head: No submental, submandibular, tonsillar, preauricular, posterior auricular or occipital adenopathy.     Left side of head: No submental, submandibular, tonsillar, preauricular, posterior auricular  or occipital adenopathy.     Cervical: No cervical adenopathy.  Skin:    General: Skin is warm and dry.  Neurological:     Mental Status: He is alert.     Cranial Nerves: No cranial nerve deficit.     Sensory: No sensory deficit.     Deep Tendon Reflexes:     Reflex Scores:      Bicep reflexes are 2+ on the right side and 2+ on the left side.      Patellar reflexes are 2+ on the right side and 2+ on the left side.    Comments: Grip equal and strong bilateral upper extremities. Gait strong and steady. Able to perform rapid alternating movement without difficulty.  Psychiatric:        Speech: Speech normal.        Behavior: Behavior normal.

## 2023-11-01 NOTE — Assessment & Plan Note (Addendum)
 Reassuring neurologic exam.  Consider change from zoloft to Effexor after collaborating with psychiatry, Dr. Chipper.  Patient and I agreed to start increased dose of Zoloft 150 mg as advised by psychiatry first and if this is ineffective in managing headache, subsequent pain, consider trial of Effexor.  He is looking into taking short-term leave of absence from work.   Advised trial of magnesium citrate 400 mg daily for headache prevention. Limit Advil use due to the risk of rebound headaches.Pending appointment with [facility] specialist, Melissa B. Genna, MD

## 2023-11-01 NOTE — Patient Instructions (Addendum)
 Start zoloft 150mg  as advised by Dr Chipper  Limit Advil due to risk of rebound headache.   Consider Effexor for headache prevention, anxiety and depression.    Start magnesium citrate 400mg  daily.   Please let me know how you are doing.

## 2023-11-01 NOTE — Assessment & Plan Note (Signed)
 Reassuring neurologic exam. Keratoconus, right eye, severe, with visual impairment, pain, chronic headache, and lightheadedness   The left eye compensates, increasing strain and headaches. He is awaiting evaluation by a cornea specialist at [facility].

## 2023-11-04 ENCOUNTER — Telehealth: Payer: Self-pay | Admitting: Internal Medicine

## 2023-11-04 NOTE — Telephone Encounter (Signed)
 I see pt's PCP is [facility], but he left Disability/Leave paperwork to be filled out by [employer]. I'll leave it in Margaret's color folder up front to be collected by whichever physician is appropriate. [facility]

## 2023-11-04 NOTE — Telephone Encounter (Signed)
 Spoke with pt to see what the disability paperwork was for and he stated that it is for his on going issues with depression/anxiety and the keratoconus and headaches that he has been having for some time now. Pt was last seen by  Dr. Marylynn on 09/10/2023 and seen by Rollene, NP on 11/01/2023. Not sure who would need to complete the disability paperwork. I have it at my desk to give to whom ever needs to complete.

## 2023-11-04 NOTE — Telephone Encounter (Signed)
 Pt did discuss the need with Rollene at last appt. Pt has completed the paperwork for the most part except for what we have to do. Spoke with Rollene and she stated that she is going to sign off on the FMLA. Paperwork has been given to [employer].

## 2023-11-05 NOTE — Telephone Encounter (Signed)
 Completed and faxed. Pt is aware and coming by the office to pick up a copy for his records.

## 2023-11-10 DIAGNOSIS — H18613 Keratoconus, stable, bilateral: Secondary | ICD-10-CM | POA: Diagnosis not present

## 2023-11-12 DIAGNOSIS — F5105 Insomnia due to other mental disorder: Secondary | ICD-10-CM | POA: Diagnosis not present

## 2023-11-12 DIAGNOSIS — F330 Major depressive disorder, recurrent, mild: Secondary | ICD-10-CM | POA: Diagnosis not present

## 2023-11-12 DIAGNOSIS — F411 Generalized anxiety disorder: Secondary | ICD-10-CM | POA: Diagnosis not present

## 2023-11-29 ENCOUNTER — Other Ambulatory Visit: Payer: Self-pay | Admitting: Internal Medicine

## 2023-12-08 ENCOUNTER — Encounter: Payer: Self-pay | Admitting: Internal Medicine

## 2023-12-09 MED ORDER — CARVEDILOL 3.125 MG PO TABS: 3.1250 mg | ORAL_TABLET | Freq: Two times a day (BID) | ORAL | 1 refills | Status: DC

## 2023-12-10 ENCOUNTER — Other Ambulatory Visit: Payer: Self-pay | Admitting: Nurse Practitioner

## 2023-12-10 MED ORDER — LOSARTAN POTASSIUM 50 MG PO TABS: 50.0000 mg | ORAL_TABLET | Freq: Every day | ORAL | 1 refills | Status: DC

## 2023-12-10 NOTE — Telephone Encounter (Signed)
 Patient states he does need this for seasonal break outs and he is almost out. Patient states he also needs a 90 prescription sent in for Losartan  that last time it got sent for a 30 day prescription instead of a 90 day. LOV  10/31/23   NOV  none scheduled

## 2023-12-31 DIAGNOSIS — Z23 Encounter for immunization: Secondary | ICD-10-CM | POA: Diagnosis not present

## 2024-01-14 ENCOUNTER — Telehealth: Admitting: Physician Assistant

## 2024-01-14 DIAGNOSIS — L0390 Cellulitis, unspecified: Secondary | ICD-10-CM | POA: Diagnosis not present

## 2024-01-14 MED ORDER — CEPHALEXIN 500 MG PO CAPS: 500.0000 mg | ORAL_CAPSULE | Freq: Four times a day (QID) | ORAL | 0 refills | Status: DC

## 2024-01-14 NOTE — Progress Notes (Signed)

## 2024-02-07 DIAGNOSIS — F330 Major depressive disorder, recurrent, mild: Secondary | ICD-10-CM | POA: Diagnosis not present

## 2024-02-07 DIAGNOSIS — F5105 Insomnia due to other mental disorder: Secondary | ICD-10-CM | POA: Diagnosis not present

## 2024-02-07 DIAGNOSIS — F411 Generalized anxiety disorder: Secondary | ICD-10-CM | POA: Diagnosis not present

## 2024-02-10 ENCOUNTER — Telehealth: Payer: Self-pay

## 2024-02-10 DIAGNOSIS — Z79899 Other long term (current) drug therapy: Secondary | ICD-10-CM

## 2024-02-10 DIAGNOSIS — I10 Essential (primary) hypertension: Secondary | ICD-10-CM

## 2024-02-10 DIAGNOSIS — E7800 Pure hypercholesterolemia, unspecified: Secondary | ICD-10-CM

## 2024-02-10 DIAGNOSIS — R7303 Prediabetes: Secondary | ICD-10-CM

## 2024-02-10 DIAGNOSIS — Z125 Encounter for screening for malignant neoplasm of prostate: Secondary | ICD-10-CM

## 2024-02-10 NOTE — Telephone Encounter (Signed)
 Copied from CRM #[id number]. Topic: Clinical - Request for Lab/Test Order >> Feb 10, 2024 12:33 PM Taleah C wrote: Reason for CRM: pt called in to request for urine sample and repeat labs that he had done 6 months ago. Please call and advise when labs are ordered.

## 2024-02-14 NOTE — Addendum Note (Signed)
 Addended by: ANICE BELT on: 02/14/2024 08:22 AM   Modules accepted: Orders

## 2024-02-14 NOTE — Addendum Note (Signed)
 Addended by: ANICE BELT on: 02/14/2024 11:58 AM   Modules accepted: Orders

## 2024-02-22 DIAGNOSIS — F411 Generalized anxiety disorder: Secondary | ICD-10-CM | POA: Diagnosis not present

## 2024-02-22 DIAGNOSIS — F5105 Insomnia due to other mental disorder: Secondary | ICD-10-CM | POA: Diagnosis not present

## 2024-02-22 DIAGNOSIS — F330 Major depressive disorder, recurrent, mild: Secondary | ICD-10-CM | POA: Diagnosis not present

## 2024-02-26 ENCOUNTER — Telehealth: Admitting: Nurse Practitioner

## 2024-02-26 DIAGNOSIS — L235 Allergic contact dermatitis due to other chemical products: Secondary | ICD-10-CM | POA: Diagnosis not present

## 2024-02-26 MED ORDER — PREDNISONE 10 MG PO TABS: ORAL_TABLET | ORAL | 0 refills | Status: DC

## 2024-02-26 NOTE — Progress Notes (Signed)
 E Visit for Rash  We are sorry that you are not feeling well. Here is how we plan to help!  Based on what you shared with me it looks like you have contact dermatitis.  Contact dermatitis is a skin rash caused by something that touches the skin and causes irritation or inflammation.  Your skin may be red, swollen, dry, cracked, and itch.  The rash should go away in a few days but can last a few weeks.  If you get a rash, it's important to figure out what caused it so the irritant can be avoided in the future.   Prednisone  10 mg daily for 6 days (see taper instructions below)  Directions for 6 day taper:  Take 4 tablets ( total 40 mg) by mouth for 2 days; take 3 tablets ( total 30 mg) by mouth for 2 days; take 2 tablets ( total 20 mg) by mouth for 1 day; take 1 tablet ( total 10 mg) by mouth for 1 day.   HOME CARE:  Take cool showers and avoid direct sunlight. Apply cool compress or wet dressings. Take a bath in an oatmeal bath.  Sprinkle content of one Aveeno packet under running faucet with comfortably warm water.  Bathe for 15-20 minutes, 1-2 times daily.  Pat dry with a towel. Do not rub the rash. Use hydrocortisone cream. Take an antihistamine like Benadryl for widespread rashes that itch.  The adult dose of Benadryl is 25-50 mg by mouth 4 times daily. Caution:  This type of medication may cause sleepiness.  Do not drink alcohol, drive, or operate dangerous machinery while taking antihistamines.  Do not take these medications if you have prostate enlargement.  Read package instructions thoroughly on all medications that you take.  GET HELP RIGHT AWAY IF:  Symptoms don't go away after treatment. Severe itching that persists. If you rash spreads or swells. If you rash begins to smell. If it blisters and opens or develops a yellow-brown crust. You develop a fever. You have a sore throat. You become short of breath.  MAKE SURE YOU:  Understand these instructions. Will watch your  condition. Will get help right away if you are not doing well or get worse.  Thank you for choosing an e-visit. Your e-visit answers were reviewed by a board certified advanced clinical practitioner to complete your personal care plan. Depending upon the condition, your plan could have included both over the counter or prescription medications. Please review your pharmacy choice. Be sure that the pharmacy you have chosen is open so that you can pick up your prescription now.  If there is a problem you may message your provider in MyChart to have the prescription routed to another pharmacy. Your safety is important to us . If you have drug allergies check your prescription carefully.  For the next 24 hours, you can use MyChart to ask questions about today's visit, request a non-urgent call back, or ask for a work or school excuse from your e-visit provider. You will get an email in the next two days asking about your experience. I hope that your e-visit has been valuable and will speed your recovery.  I have spent 5 minutes in review of e-visit questionnaire, review and updating patient chart, medical decision making and response to patient.   Cheryl Stabenow W Tiny Rietz, NP

## 2024-03-08 ENCOUNTER — Telehealth: Admitting: Family Medicine

## 2024-03-08 DIAGNOSIS — L235 Allergic contact dermatitis due to other chemical products: Secondary | ICD-10-CM

## 2024-03-08 NOTE — Progress Notes (Signed)
 Because you had prednisone  treatment and you continue to have a rash- you need to follow up in person for an assessment .We feel your condition warrants further evaluation and recommend that you be seen in a face-to-face visit PCP and or local urgent care.   NOTE: There will be NO CHARGE for this E-Visit   If you are having a true medical emergency, please call 911.

## 2024-03-14 ENCOUNTER — Ambulatory Visit: Admitting: Internal Medicine

## 2024-03-14 ENCOUNTER — Encounter: Payer: Self-pay | Admitting: Internal Medicine

## 2024-03-14 VITALS — BP 118/85 | HR 90 | Ht 74.0 in | Wt 229.4 lb

## 2024-03-14 DIAGNOSIS — L508 Other urticaria: Secondary | ICD-10-CM

## 2024-03-14 DIAGNOSIS — Z79899 Other long term (current) drug therapy: Secondary | ICD-10-CM

## 2024-03-14 DIAGNOSIS — E7800 Pure hypercholesterolemia, unspecified: Secondary | ICD-10-CM

## 2024-03-14 DIAGNOSIS — R7303 Prediabetes: Secondary | ICD-10-CM

## 2024-03-14 DIAGNOSIS — F411 Generalized anxiety disorder: Secondary | ICD-10-CM | POA: Diagnosis not present

## 2024-03-14 DIAGNOSIS — F5105 Insomnia due to other mental disorder: Secondary | ICD-10-CM

## 2024-03-14 DIAGNOSIS — I10 Essential (primary) hypertension: Secondary | ICD-10-CM | POA: Diagnosis not present

## 2024-03-14 DIAGNOSIS — Z125 Encounter for screening for malignant neoplasm of prostate: Secondary | ICD-10-CM

## 2024-03-14 DIAGNOSIS — F418 Other specified anxiety disorders: Secondary | ICD-10-CM

## 2024-03-14 DIAGNOSIS — I83812 Varicose veins of left lower extremities with pain: Secondary | ICD-10-CM

## 2024-03-14 DIAGNOSIS — R252 Cramp and spasm: Secondary | ICD-10-CM | POA: Diagnosis not present

## 2024-03-14 MED ORDER — LOSARTAN POTASSIUM-HCTZ 50-12.5 MG PO TABS: 1.0000 | ORAL_TABLET | Freq: Every day | ORAL | 1 refills | Status: DC

## 2024-03-14 MED ORDER — GABAPENTIN 100 MG PO CAPS: 100.0000 mg | ORAL_CAPSULE | Freq: Three times a day (TID) | ORAL | 3 refills | Status: DC

## 2024-03-14 NOTE — Patient Instructions (Addendum)
 Your rash appears to be stress related.  Stop the benadryl and increase your Zyrtec yo 4 times daily   Add the Gabapentin  as a  trial , starting with 100 mg every 8 hours. The dose can be increased or decreased in frequency as needed  Ultrasound to rule out DVT in left thigh given recent travel  Referral to [facility]  Changing losartan  to losartan /hct for better BP control.

## 2024-03-14 NOTE — Progress Notes (Unsigned)
 Subjective:  Patient ID: Kevin Barry, male    DOB: 09-Mar-1961  Age: 63 y.o. MRN: [id number]  CC: There were no encounter diagnoses.   HPI Kevin Barry presents for  Chief Complaint  Patient presents with   Rash    Reoccurring rash     1) PRURITIC RASH ON TRUNK,  RESOLVES TRANSIENTLY  WITH PREDNISONE  TAPER  BUT RECURS DESPITE MEDICATION CHANGES. Has changed all soaps ad detergents to hypoallergenic. History of  travel to [place] but did not stay in hotels,  and hands are not involved.  2) left thigh cramps daily ,  aggravated by inactivity, , improved with walking,  recurrent  prior varicose vein surgery . 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 Died in hospice per her wishes, he was with her.  Daughter Vernell, is now acting out as a teenager with Down's Syndrome,  sister in law Shasta Ip has MS, increased needs   4) Hypertension: patient checks blood pressure twice monthly at home.  Readings have been for the most part <130/80 at rest . Patient is following a reduced salt diet most days and is taking medications as prescribed    Outpatient Medications Prior to Visit  Medication Sig Dispense Refill   acetaminophen (TYLENOL) 500 MG tablet Take 1,000 mg by mouth every 6 (six) hours as needed.     albuterol  (VENTOLIN  HFA) 108 (90 Base) MCG/ACT inhaler Inhale 2 puffs into the lungs every 6 (six) hours as needed for wheezing or shortness of breath. 18 g 0   carvedilol  (COREG ) 3.125 MG tablet Take 1 tablet (3.125 mg total) by mouth 2 (two) times daily with a meal. 180 tablet 1   clotrimazole -betamethasone  (LOTRISONE ) cream APPLY 1 APPLICATION TOPICALLY DAILY 30 g 0   eszopiclone (LUNESTA) 2 MG TABS tablet Take 2 mg by mouth at bedtime as needed for sleep.     fluticasone  (FLOVENT  HFA) 220 MCG/ACT inhaler Inhale 2 puffs into the lungs in the morning and at bedtime. 1 each 12   losartan  (COZAAR ) 50 MG  tablet Take 1 tablet (50 mg total) by mouth daily. 90 tablet 1   mupirocin  ointment (BACTROBAN ) 2 % Apply 1 Application topically 2 (two) times daily. 22 g 0   sertraline (ZOLOFT) 100 MG tablet Take 100 mg by mouth daily.     simvastatin  (ZOCOR ) 20 MG tablet Take 1 tablet (20 mg total) by mouth at bedtime. 90 tablet 3   triamcinolone  cream (KENALOG ) 0.1 % Apply 1 Application topically 2 (two) times daily. 30 g 0   cephALEXin  (KEFLEX ) 500 MG capsule Take 1 capsule (500 mg total) by mouth 4 (four) times daily. (Patient not taking: Reported on 03/14/2024) 20 capsule 0   predniSONE  (DELTASONE ) 10 MG tablet Take 4 tablets ( total 40 mg) by mouth for 2 days; take 3 tablets ( total 30 mg) by mouth for 2 days; take 2 tablets ( total 20 mg) by mouth for 1 day; take 1 tablet ( total 10 mg) by mouth for 1 day. (Patient not taking: Reported on 03/14/2024) 17 tablet 0   No facility-administered medications prior to visit.    Review of Systems;  Patient denies headache, fevers, malaise, unintentional weight loss,  eye pain, sinus congestion and sinus pain, sore throat, dysphagia,  hemoptysis , cough, dyspnea, wheezing, chest pain, palpitations, orthopnea, edema, abdominal pain, nausea, melena, diarrhea, constipation, flank pain,  dysuria, hematuria, urinary  Frequency, nocturia, numbness, tingling, seizures,  Focal weakness, Loss of consciousness,  Tremor, insomnia, depression, anxiety, and suicidal ideation.      Objective:  BP 138/88   Pulse 90   Ht 6' 2 (1.88 m)   Wt 229 lb 6.4 oz (104.1 kg)   SpO2 97%   BMI 29.45 kg/m   BP Readings from Last 3 Encounters:  03/14/24 138/88  11/01/23 130/82  09/10/23 118/70    Wt Readings from Last 3 Encounters:  03/14/24 229 lb 6.4 oz (104.1 kg)  11/01/23 229 lb 8 oz (104.1 kg)  09/10/23 229 lb 6.4 oz (104.1 kg)    Physical Exam Vitals reviewed.  Constitutional:      General: He is not in acute distress.    Appearance: Normal appearance. He is normal  weight. He is not ill-appearing, toxic-appearing or diaphoretic.  HENT:     Head: Normocephalic and atraumatic.     Right Ear: Tympanic membrane, ear canal and external ear normal. There is no impacted cerumen.     Left Ear: Tympanic membrane, ear canal and external ear normal. There is no impacted cerumen.     Nose: Nose normal.     Mouth/Throat:     Mouth: Mucous membranes are moist.     Pharynx: Oropharynx is clear.  Eyes:     General: No scleral icterus.       Right eye: No discharge.        Left eye: No discharge.     Conjunctiva/sclera: Conjunctivae normal.  Neck:     Thyroid : No thyromegaly.     Vascular: No carotid bruit or JVD.  Cardiovascular:     Rate and Rhythm: Normal rate and regular rhythm.     Heart sounds: Normal heart sounds.  Pulmonary:     Effort: Pulmonary effort is normal. No respiratory distress.     Breath sounds: Normal breath sounds.  Abdominal:     General: Bowel sounds are normal.     Palpations: Abdomen is soft. There is no mass.     Tenderness: There is no abdominal tenderness. There is no guarding or rebound.  Musculoskeletal:        General: Normal range of motion.     Cervical back: Normal range of motion and neck supple.  Lymphadenopathy:     Cervical: No cervical adenopathy.  Skin:    General: Skin is warm and dry.     Findings: Erythema and rash present. Rash is macular.     Comments: Macular blanching rash on chest wall and upper back   Neurological:     General: No focal deficit present.     Mental Status: He is alert and oriented to person, place, and time. Mental status is at baseline.  Psychiatric:        Mood and Affect: Mood normal.        Behavior: Behavior normal.        Thought Content: Thought content normal.        Judgment: Judgment normal.    Lab Results  Component Value Date   HGBA1C 6.3 09/10/2023   HGBA1C 6.3 07/23/2022   HGBA1C 6.3 04/09/2021    Lab Results  Component Value Date   CREATININE 1.01 09/10/2023    CREATININE 1.01 07/23/2022   CREATININE 0.97 04/09/2021    Lab Results  Component Value Date   WBC 5.3 09/10/2023   HGB 13.5 09/10/2023   HCT 39.9 09/10/2023   PLT 224.0 09/10/2023  GLUCOSE 104 (H) 09/10/2023   CHOL 130 09/10/2023   TRIG 157.0 (H) 09/10/2023   HDL 34.90 (L) 09/10/2023   LDLDIRECT 73.0 09/10/2023   LDLCALC 64 09/10/2023   ALT 26 09/10/2023   AST 22 09/10/2023   NA 139 09/10/2023   K 4.1 09/10/2023   CL 103 09/10/2023   CREATININE 1.01 09/10/2023   BUN 12 09/10/2023   CO2 30 09/10/2023   TSH 1.290 09/10/2023   PSA 1.16 07/23/2022   HGBA1C 6.3 09/10/2023   MICROALBUR <0.7 09/10/2023    CT CARDIAC SCORING Addendum Date: 05/15/2016 ADDENDUM REPORT: 05/15/2016 10:28 CLINICAL DATA:  Risk stratification EXAM: Coronary Calcium  Score TECHNIQUE: The patient was scanned on a Siemens Somatom 64 slice scanner. Axial non-contrast 3 mm slices were carried out through the heart. The data set was analyzed on a dedicated work station and scored using the Agatson method. FINDINGS: Non-cardiac: See separate report from [facility]. Ascending Aorta:  Normal size, no calcifications. Pericardium: Normal. Coronary arteries:  Normal origin. IMPRESSION: Coronary calcium  score of 0. This was 0 percentile for age and sex matched control. Leim Moose Electronically Signed   By: Leim Moose   On: 05/15/2016 10:28   Result Date: 05/15/2016 EXAM: OVER-READ INTERPRETATION  CT CHEST The following report is an over-read performed by radiologist Dr. Franky Leff [facility] on 05/15/2016. This over-read does not include interpretation of cardiac or coronary anatomy or pathology. The coronary calcium  score interpretation by the cardiologist is attached. COMPARISON:  None. FINDINGS: Cardiovascular: Heart is normal size. Visualized aorta normal caliber. Mediastinum/Nodes: No adenopathy in the lower mediastinum or hila. Lungs/Pleura: Visualized lungs are clear.  No  effusions. Upper Abdomen: Imaging into the upper abdomen shows no acute findings. Musculoskeletal: Chest wall soft tissues and bony structures unremarkable. IMPRESSION: No acute or significant extracardiac abnormality. Electronically Signed: By: Franky Crease M.D. On: 05/15/2016 09:09    Assessment & Plan:  .There are no diagnoses linked to this encounter.   I spent 34 minutes on the day of this face to face encounter reviewing patient's  most recent visit with cardiology,  nephrology,  and neurology,  prior relevant surgical and non surgical procedures, recent  labs and imaging studies, counseling on weight management,  reviewing the assessment and plan with patient, and post visit ordering and reviewing of  diagnostics and therapeutics with patient  .   Follow-up: No follow-ups on file.   Verneita LITTIE Kettering, MD

## 2024-03-15 LAB — CBC WITH DIFFERENTIAL/PLATELET
Basophils Absolute: 0.1 K/uL (ref 0.0–0.1)
Basophils Relative: 0.9 % (ref 0.0–3.0)
Eosinophils Absolute: 0.2 K/uL (ref 0.0–0.7)
Eosinophils Relative: 3 % (ref 0.0–5.0)
HCT: 39.2 % (ref 39.0–52.0)
Hemoglobin: 13.3 g/dL (ref 13.0–17.0)
Lymphocytes Relative: 21.9 % (ref 12.0–46.0)
Lymphs Abs: 1.4 K/uL (ref 0.7–4.0)
MCHC: 33.9 g/dL (ref 30.0–36.0)
MCV: 88.8 fl (ref 78.0–100.0)
Monocytes Absolute: 0.4 K/uL (ref 0.1–1.0)
Monocytes Relative: 6.1 % (ref 3.0–12.0)
Neutro Abs: 4.4 K/uL (ref 1.4–7.7)
Neutrophils Relative %: 68.1 % (ref 43.0–77.0)
Platelets: 227 K/uL (ref 150.0–400.0)
RBC: 4.41 Mil/uL (ref 4.22–5.81)
RDW: 13.3 % (ref 11.5–15.5)
WBC: 6.5 K/uL (ref 4.0–10.5)

## 2024-03-15 LAB — LIPID PANEL
Cholesterol: 134 mg/dL (ref 28–200)
HDL: 40 mg/dL (ref >39.00)
LDL Cholesterol: 61 mg/dL (ref 10–99)
NonHDL: 94.45
Total CHOL/HDL Ratio: 3
Triglycerides: 167 mg/dL — ABNORMAL HIGH (ref 10.0–149.0)
VLDL: 33.4 mg/dL (ref 0.0–40.0)

## 2024-03-15 LAB — COMPREHENSIVE METABOLIC PANEL WITH GFR
ALT: 35 U/L (ref 3–53)
AST: 24 U/L (ref 5–37)
Albumin: 4.5 g/dL (ref 3.5–5.2)
Alkaline Phosphatase: 38 U/L — ABNORMAL LOW (ref 39–117)
BUN: 10 mg/dL (ref 6–23)
CO2: 32 meq/L (ref 19–32)
Calcium: 9.4 mg/dL (ref 8.4–10.5)
Chloride: 100 meq/L (ref 96–112)
Creatinine, Ser: 0.92 mg/dL (ref 0.40–1.50)
GFR: 90.02 mL/min (ref >60.00)
Glucose, Bld: 138 mg/dL — ABNORMAL HIGH (ref 70–99)
Potassium: 4 meq/L (ref 3.5–5.1)
Sodium: 139 meq/L (ref 135–145)
Total Bilirubin: 0.3 mg/dL (ref 0.2–1.2)
Total Protein: 7.8 g/dL (ref 6.0–8.3)

## 2024-03-15 LAB — TSH: TSH: 1.63 u[IU]/mL (ref 0.35–5.50)

## 2024-03-15 LAB — MICROALBUMIN / CREATININE URINE RATIO
Creatinine,U: 67.8 mg/dL
Microalb Creat Ratio: 10.6 mg/g (ref 0.0–30.0)
Microalb, Ur: 0.7 mg/dL (ref 0.7–1.9)

## 2024-03-15 LAB — HEMOGLOBIN A1C: Hgb A1c MFr Bld: 6.2 % (ref 4.6–6.5)

## 2024-03-15 LAB — PSA: PSA: 1.07 ng/mL (ref 0.10–4.00)

## 2024-03-16 DIAGNOSIS — L508 Other urticaria: Secondary | ICD-10-CM | POA: Insufficient documentation

## 2024-03-16 NOTE — Assessment & Plan Note (Addendum)
 Managed by Dr Chipper with sertraline ; lunesta 2 mg for insomnia

## 2024-03-16 NOTE — Assessment & Plan Note (Signed)
Now managed with Lunesta 1 mg by Dr Kapur .  encouraged to practice mindfulness exercises and resume daily exercise as well  

## 2024-03-16 NOTE — Assessment & Plan Note (Signed)
 Well controlled on current regimen of losartan /hct. Renal function stable, no changes today.  Lab Results  Component Value Date   CREATININE 0.92 03/14/2024   Lab Results  Component Value Date   NA 139 03/14/2024   K 4.0 03/14/2024   CL 100 03/14/2024   CO2 32 03/14/2024   Lab Results  Component Value Date   MICROALBUR 0.7 03/14/2024   MICROALBUR <0.7 09/10/2023

## 2024-03-16 NOTE — Assessment & Plan Note (Signed)
 Given recurrent cramping in left thigh and recent international travel  will rule out DVT and refer to [facility] for treatment of varicosity

## 2024-03-16 NOTE — Assessment & Plan Note (Signed)
 Suggested by history and exam. Gabapentin  and zyrtec prescribed.

## 2024-03-17 ENCOUNTER — Ambulatory Visit
Admission: RE | Admit: 2024-03-17 | Discharge: 2024-03-17 | Disposition: A | Discharge status: Home / Self Care | Source: Ambulatory Visit | Attending: Internal Medicine | Admitting: Internal Medicine

## 2024-03-17 ENCOUNTER — Ambulatory Visit: Payer: Self-pay | Admitting: Internal Medicine

## 2024-03-17 DIAGNOSIS — R252 Cramp and spasm: Secondary | ICD-10-CM | POA: Diagnosis not present

## 2024-03-17 DIAGNOSIS — M79652 Pain in left thigh: Secondary | ICD-10-CM | POA: Diagnosis not present

## 2024-03-18 ENCOUNTER — Ambulatory Visit: Payer: Self-pay | Admitting: Internal Medicine

## 2024-04-06 ENCOUNTER — Encounter: Payer: Self-pay | Admitting: Internal Medicine

## 2024-04-07 ENCOUNTER — Other Ambulatory Visit: Payer: Self-pay

## 2024-04-11 ENCOUNTER — Telehealth: Payer: Self-pay

## 2024-04-11 MED ORDER — LOSARTAN POTASSIUM-HCTZ 50-12.5 MG PO TABS: 1.0000 | ORAL_TABLET | Freq: Every day | ORAL | 1 refills | Status: AC

## 2024-04-11 MED ORDER — CARVEDILOL 3.125 MG PO TABS: 3.1250 mg | ORAL_TABLET | Freq: Two times a day (BID) | ORAL | 1 refills | Status: AC

## 2024-04-11 MED ORDER — LOSARTAN POTASSIUM-HCTZ 50-12.5 MG PO TABS: 1.0000 | ORAL_TABLET | Freq: Every day | ORAL | 1 refills | Status: DC

## 2024-04-11 MED ORDER — GABAPENTIN 100 MG PO CAPS: 100.0000 mg | ORAL_CAPSULE | Freq: Three times a day (TID) | ORAL | 1 refills | Status: AC

## 2024-04-11 MED ORDER — SIMVASTATIN 20 MG PO TABS: 20.0000 mg | ORAL_TABLET | Freq: Every day | ORAL | 3 refills | Status: AC

## 2024-04-11 MED ORDER — SIMVASTATIN 20 MG PO TABS: 20.0000 mg | ORAL_TABLET | Freq: Every day | ORAL | 3 refills | Status: DC

## 2024-04-11 MED ORDER — GABAPENTIN 100 MG PO CAPS: 100.0000 mg | ORAL_CAPSULE | Freq: Three times a day (TID) | ORAL | 1 refills | Status: DC

## 2024-04-11 MED ORDER — CARVEDILOL 3.125 MG PO TABS: 3.1250 mg | ORAL_TABLET | Freq: Two times a day (BID) | ORAL | 1 refills | Status: DC

## 2024-04-11 NOTE — Telephone Encounter (Signed)
 Medications have been sent to new mail order pharmacy

## 2024-05-05 ENCOUNTER — Telehealth: Payer: Self-pay

## 2024-05-05 NOTE — Telephone Encounter (Signed)
 Copied from CRM [id number]. Topic: General - Other >> May 05, 2024  4:33 PM Sophia H wrote: Reason for CRM: Spoke with Elenor - with Dr. Elspeth Pap office who states she needs to set up a peer to peer with provider regarding short term disability paperwork.  Stated patient listed NP Rollene [facility] as provider.. Please reach out # [phone] ext [phone]
